# Patient Record
Sex: Male | Born: 1960 | Race: White | Hispanic: No | Marital: Married | Smoking: Former smoker
Health system: Southern US, Community
[De-identification: ages and names within clinical notes are randomized; demographics above are authoritative.]

## PROBLEM LIST (undated history)

## (undated) DIAGNOSIS — Z8719 Personal history of other diseases of the digestive system: Secondary | ICD-10-CM

## (undated) DIAGNOSIS — M5124 Other intervertebral disc displacement, thoracic region: Secondary | ICD-10-CM

## (undated) DIAGNOSIS — Z8711 Personal history of peptic ulcer disease: Secondary | ICD-10-CM

## (undated) DIAGNOSIS — I480 Paroxysmal atrial fibrillation: Secondary | ICD-10-CM

## (undated) HISTORY — PX: COLONOSCOPY: SHX174

## (undated) HISTORY — PX: OTHER SURGICAL HISTORY: SHX169

## (undated) HISTORY — PX: ESOPHAGOGASTRODUODENOSCOPY: SHX1529

## (undated) HISTORY — PX: FOOT SURGERY: SHX648

## (undated) HISTORY — DX: Paroxysmal atrial fibrillation: I48.0

---

## 2000-06-20 ENCOUNTER — Emergency Department (HOSPITAL_COMMUNITY): Admission: EM | Admit: 2000-06-20 | Discharge: 2000-06-20 | Payer: Self-pay | Admitting: *Deleted

## 2011-11-23 ENCOUNTER — Other Ambulatory Visit: Payer: Self-pay | Admitting: Gastroenterology

## 2011-11-23 DIAGNOSIS — R1013 Epigastric pain: Secondary | ICD-10-CM

## 2011-11-26 ENCOUNTER — Ambulatory Visit
Admission: RE | Admit: 2011-11-26 | Discharge: 2011-11-26 | Disposition: A | Discharge status: Home / Self Care | Payer: 59 | Source: Ambulatory Visit | Attending: Gastroenterology | Admitting: Gastroenterology

## 2011-11-26 DIAGNOSIS — R1013 Epigastric pain: Secondary | ICD-10-CM

## 2013-01-30 ENCOUNTER — Encounter (HOSPITAL_BASED_OUTPATIENT_CLINIC_OR_DEPARTMENT_OTHER): Payer: Self-pay | Admitting: Emergency Medicine

## 2013-01-30 ENCOUNTER — Emergency Department (HOSPITAL_BASED_OUTPATIENT_CLINIC_OR_DEPARTMENT_OTHER)
Admission: EM | Admit: 2013-01-30 | Discharge: 2013-01-31 | Disposition: A | Discharge status: Home / Self Care | Payer: 59 | Attending: Emergency Medicine | Admitting: Emergency Medicine

## 2013-01-30 ENCOUNTER — Emergency Department (HOSPITAL_BASED_OUTPATIENT_CLINIC_OR_DEPARTMENT_OTHER): Payer: 59

## 2013-01-30 DIAGNOSIS — Z8711 Personal history of peptic ulcer disease: Secondary | ICD-10-CM | POA: Insufficient documentation

## 2013-01-30 DIAGNOSIS — R350 Frequency of micturition: Secondary | ICD-10-CM | POA: Insufficient documentation

## 2013-01-30 DIAGNOSIS — K5732 Diverticulitis of large intestine without perforation or abscess without bleeding: Secondary | ICD-10-CM | POA: Insufficient documentation

## 2013-01-30 DIAGNOSIS — K5792 Diverticulitis of intestine, part unspecified, without perforation or abscess without bleeding: Secondary | ICD-10-CM

## 2013-01-30 HISTORY — DX: Personal history of other diseases of the digestive system: Z87.19

## 2013-01-30 HISTORY — DX: Personal history of peptic ulcer disease: Z87.11

## 2013-01-30 LAB — URINALYSIS, ROUTINE W REFLEX MICROSCOPIC
Glucose, UA: NEGATIVE mg/dL
Hgb urine dipstick: NEGATIVE
Ketones, ur: NEGATIVE mg/dL
Leukocytes, UA: NEGATIVE
Protein, ur: NEGATIVE mg/dL
Urobilinogen, UA: 0.2 mg/dL (ref 0.0–1.0)

## 2013-01-30 LAB — CBC WITH DIFFERENTIAL/PLATELET
Basophils Relative: 0 % (ref 0–1)
Eosinophils Absolute: 0.2 10*3/uL (ref 0.0–0.7)
Eosinophils Relative: 2 % (ref 0–5)
Lymphs Abs: 2.8 10*3/uL (ref 0.7–4.0)
MCH: 31.6 pg (ref 26.0–34.0)
MCHC: 35.5 g/dL (ref 30.0–36.0)
MCV: 89 fL (ref 78.0–100.0)
Platelets: 227 10*3/uL (ref 150–400)
RBC: 5.44 MIL/uL (ref 4.22–5.81)

## 2013-01-30 MED ORDER — SODIUM CHLORIDE 0.9 % IV BOLUS (SEPSIS)
1000.0000 mL | Freq: Once | INTRAVENOUS | Status: AC
  Administered 2013-01-31: 1000 mL via INTRAVENOUS

## 2013-01-30 MED ORDER — METRONIDAZOLE IN NACL 5-0.79 MG/ML-% IV SOLN
500.0000 mg | Freq: Once | INTRAVENOUS | Status: AC
  Administered 2013-01-31: 500 mg via INTRAVENOUS
  Filled 2013-01-30: qty 100

## 2013-01-30 MED ORDER — HYDROMORPHONE HCL PF 1 MG/ML IJ SOLN
1.0000 mg | Freq: Once | INTRAMUSCULAR | Status: AC
  Administered 2013-01-31: 1 mg via INTRAVENOUS
  Filled 2013-01-30: qty 1

## 2013-01-30 MED ORDER — ONDANSETRON HCL 4 MG/2ML IJ SOLN
4.0000 mg | Freq: Once | INTRAMUSCULAR | Status: AC
  Administered 2013-01-31: 4 mg via INTRAVENOUS
  Filled 2013-01-30: qty 2

## 2013-01-30 MED ORDER — CIPROFLOXACIN IN D5W 400 MG/200ML IV SOLN
400.0000 mg | Freq: Once | INTRAVENOUS | Status: AC
  Administered 2013-01-31: 400 mg via INTRAVENOUS
  Filled 2013-01-30: qty 200

## 2013-01-30 NOTE — ED Provider Notes (Signed)
CSN: [id number]     Arrival date & time 01/30/13  2250 History  This chart was scribed for Nicolas Quarry, MD by Nicolas Curtis, ED Scribe. This patient was seen in room Nicolas Curtis/Nicolas Curtis and the patient's care was started at 11:38 PM  Chief Complaint  Patient presents with  . Abdominal Pain   The history is provided by the patient. No language interpreter was used.   HPI Comments: Nicolas Curtis is a 52 y.o. male with a hx of ulcers who presents to the Emergency Department complaining of LLQ abd pain that began 5 days ago. Pt states his pain worsens when he is lying down  and moving for long periods of times. He states he pain has been constant and describes it as "stabbing pressure that does not radiate". Pt rates his current pain 5/10 currently. He states he has not had regular BM but states he has had frequency. Pt reports taking Malox with no relief. Pt states he has back complication (does not specify). Pt denies nausea, emesis, heamturia,and dysuria.Pt states he is currently taking Ameprazol 4x a day. Pt denies smoking and states he is an occasional drinker. His last drink was 24 hours ago.Pt deneis having any recent surgeries. Pt denies having colonoscopy done when he turned 52.  Pts PCP is Benedetto Goad.  Past Medical History  Diagnosis Date  . History of stomach ulcers    History reviewed. No pertinent past surgical history. No family history on file. History  Substance Use Topics  . Smoking status: Never Smoker   . Smokeless tobacco: Not on file  . Alcohol Use: Yes    Review of Systems  Constitutional: Negative for fever.  Gastrointestinal: Positive for abdominal pain. Negative for nausea, vomiting and diarrhea.  Genitourinary: Positive for frequency. Negative for urgency and hematuria.    Allergies  Codeine and Oxycodone  Home Medications   Current Outpatient Rx  Name  Route  Sig  Dispense  Refill  . DICYCLOMINE HCL PO   Oral   Take by mouth.         . MECLIZINE  HCL PO   Oral   Take by mouth.         . Omeprazole (PRILOSEC PO)   Oral   Take by mouth.          BP 175/93  Pulse 108  Temp(Src) 99.6 F (37.6 C) (Oral)  Resp 20  Ht 5\' 11"  (1.803 m)  Wt 229 lb (103.874 kg)  BMI 31.95 kg/m2  SpO2 98%  Physical Exam  Nursing note and vitals reviewed. Constitutional: He is oriented to person, place, and time. He appears well-developed and well-nourished.  HENT:  Head: Normocephalic and atraumatic.  Right Ear: External ear normal.  Left Ear: External ear normal.  Nose: Nose normal.  Mouth/Throat: Oropharynx is clear and moist.  Eyes: Conjunctivae and EOM are normal. Pupils are equal, round, and reactive to light.  Neck: Normal range of motion. Neck supple.  Cardiovascular: Normal rate, regular rhythm, normal heart sounds and intact distal pulses.   Pulmonary/Chest: Effort normal and breath sounds normal. No respiratory distress. He has no wheezes. He exhibits no tenderness.  Abdominal: Soft. Bowel sounds are normal. He exhibits no distension and no mass. There is no tenderness. There is no guarding.  Tenderness in LLQ.  Musculoskeletal: Normal range of motion.  Neurological: He is alert and oriented to person, place, and time. He has normal reflexes. He exhibits normal muscle tone. Coordination normal.  Skin:  Skin is warm and dry.  Psychiatric: He has a normal mood and affect. His behavior is normal. Judgment and thought content normal.   ED Course  Procedures DIAGNOSTIC STUDIES: Oxygen Saturation is 98% on RA, normal by my interpretation.    COORDINATION OF CARE: 11:43 PM-Discussed treatment plan which includes X-Julieanna Geraci, CBC, and UA. Pt agreed to plan.   Labs Review Labs Reviewed  CBC WITH DIFFERENTIAL - Abnormal; Notable for the following:    WBC 14.8 (*)    Hemoglobin 17.2 (*)    Neutro Abs 10.1 (*)    Monocytes Absolute 1.6 (*)    All other components within normal limits  COMPREHENSIVE METABOLIC PANEL - Abnormal; Notable  for the following:    Glucose, Bld 147 (*)    GFR calc non Af Amer 63 (*)    GFR calc Af Amer 73 (*)    All other components within normal limits  URINALYSIS, ROUTINE W REFLEX MICROSCOPIC - Abnormal; Notable for the following:    APPearance CLOUDY (*)    All other components within normal limits   Imaging Review Ct Abdomen Pelvis W Contrast  01/31/2013   CLINICAL DATA:  Left abdominal pain with nausea and vomiting.  EXAM: CT ABDOMEN AND PELVIS WITH CONTRAST  TECHNIQUE: Multidetector CT imaging of the abdomen and pelvis was performed using the standard protocol following bolus administration of intravenous contrast.  CONTRAST:  50mL OMNIPAQUE IOHEXOL 300 MG/ML SOLN, OMNIPAQUE IOHEXOL 300 MG/ML SOLN  COMPARISON:  Abdominal sonogram November 26, 2011  FINDINGS: Included view of the lung bases are clear. Visualized heart and pericardium are unremarkable.  The liver is diffusely hypodense most consistent with steatosis, and is otherwise unremarkable. The spleen, gallbladder, pancreas and adrenal glands are unremarkable.  Less than 5 mm segment of distal descending colonic inflammatory changes associated with diverticulum, trace free fluid within the left pericolic gutter, no drainable fluid collection/abscess. The stomach, small bowel are normal in course and caliber without inflammatory changes. Normal appendix. No intraperitoneal free air.  Kidneys are orthotopic, demonstrating symmetric enhancement without nephrolithiasis, hydronephrosis or renal masses. The unopacified ureters are normal in course and caliber. Delayed imaging through the kidneys demonstrates symmetric prompt excretion to the proximal urinary collecting system. Urinary bladder is partially distended and unremarkable.  Great vessels are normal in course and caliber with mild calcific atherosclerosis. No lymphadenopathy by CT size criteria ; subcentimeter porta hepatis lymph nodes may be reactive. Internal reproductive organs are  unremarkable. The soft tissues and included osseous structures are nonsuspicious. Mild degenerative disc disease L4-5 and L5-S1.  IMPRESSION: Short segment of distal descending colon acute diverticulitis without bowel perforation nor abscess.  Fatty liver.   Electronically Signed   By: Awilda Metro   On: 01/31/2013 01:47    EKG Interpretation   None       MDM  No diagnosis found. 52 year old male who presents today with multiple day history of left lower quadrant pain. He has not had nausea, vomiting, or diarrhea. He has been taking by mouth well. Exam here reveals a left lower quadrant tenderness with laboratory results revealing leukocytosis at 14,800. CT scan is consistent with diverticulitis. The patient is in generally good health, hemodynamically stable, able to take by mouth and has a primary care Dr. I discussed the patient's condition with the patient, his wife, and another family member. He has had some allergies in the past 2 oxycodone and codeine. He'll be given a prescription for hydrocodone, Cipro, and Flagyl. We have  discussed return precautions. We have discussed the need for followup with his primary care Dr. on Thursday. We have discussed that he will be out of work until Friday.  I personally performed the services described in this documentation, which was scribed in my presence. The recorded information has been reviewed and considered.   Nicolas Quarry, MD 01/31/13 [phone]

## 2013-01-30 NOTE — ED Notes (Signed)
C/o abd pain, fever since thanksgiving

## 2013-01-31 LAB — COMPREHENSIVE METABOLIC PANEL
ALT: 30 U/L (ref 0–53)
Albumin: 4.1 g/dL (ref 3.5–5.2)
BUN: 12 mg/dL (ref 6–23)
Calcium: 9.9 mg/dL (ref 8.4–10.5)
GFR calc Af Amer: 73 mL/min — ABNORMAL LOW (ref >90)
Glucose, Bld: 147 mg/dL — ABNORMAL HIGH (ref 70–99)
Sodium: 139 mEq/L (ref 135–145)
Total Protein: 7.6 g/dL (ref 6.0–8.3)

## 2013-01-31 MED ORDER — HYDROMORPHONE HCL PF 1 MG/ML IJ SOLN
1.0000 mg | Freq: Once | INTRAMUSCULAR | Status: AC
  Administered 2013-01-31: 1 mg via INTRAVENOUS
  Filled 2013-01-31: qty 1

## 2013-01-31 MED ORDER — ONDANSETRON 8 MG PO TBDP: 8.0000 mg | ORAL_TABLET | Freq: Three times a day (TID) | ORAL | Status: DC | PRN

## 2013-01-31 MED ORDER — METRONIDAZOLE 500 MG PO TABS: 500.0000 mg | ORAL_TABLET | Freq: Two times a day (BID) | ORAL | Status: DC

## 2013-01-31 MED ORDER — ONDANSETRON 8 MG PO TBDP
8.0000 mg | ORAL_TABLET | Freq: Once | ORAL | Status: AC
  Administered 2013-01-31: 8 mg via ORAL
  Filled 2013-01-31: qty 1

## 2013-01-31 MED ORDER — IOHEXOL 300 MG/ML  SOLN
50.0000 mL | Freq: Once | INTRAMUSCULAR | Status: AC | PRN
  Administered 2013-01-31: 50 mL via ORAL

## 2013-01-31 MED ORDER — HYDROCODONE-ACETAMINOPHEN 5-325 MG PO TABS: 2.0000 | ORAL_TABLET | ORAL | Status: DC | PRN

## 2013-01-31 MED ORDER — HYDROCODONE-ACETAMINOPHEN 5-325 MG PO TABS
1.0000 | ORAL_TABLET | Freq: Once | ORAL | Status: AC
  Administered 2013-01-31: 1 via ORAL
  Filled 2013-01-31: qty 1

## 2013-01-31 MED ORDER — IOHEXOL 300 MG/ML  SOLN
100.0000 mL | Freq: Once | INTRAMUSCULAR | Status: AC | PRN
  Administered 2013-01-31: 100 mL via INTRAVENOUS

## 2013-01-31 MED ORDER — CIPROFLOXACIN HCL 500 MG PO TABS: 500.0000 mg | ORAL_TABLET | Freq: Two times a day (BID) | ORAL | Status: DC

## 2013-01-31 NOTE — ED Notes (Signed)
MD at bedside discussing test results and plan of care. 

## 2013-01-31 NOTE — ED Notes (Signed)
Patient transported to CT 

## 2013-06-09 ENCOUNTER — Emergency Department (HOSPITAL_BASED_OUTPATIENT_CLINIC_OR_DEPARTMENT_OTHER)
Admission: EM | Admit: 2013-06-09 | Discharge: 2013-06-09 | Disposition: A | Discharge status: Home / Self Care | Payer: 59 | Attending: Emergency Medicine | Admitting: Emergency Medicine

## 2013-06-09 ENCOUNTER — Emergency Department (HOSPITAL_BASED_OUTPATIENT_CLINIC_OR_DEPARTMENT_OTHER): Payer: 59

## 2013-06-09 ENCOUNTER — Encounter (HOSPITAL_BASED_OUTPATIENT_CLINIC_OR_DEPARTMENT_OTHER): Payer: Self-pay | Admitting: Emergency Medicine

## 2013-06-09 DIAGNOSIS — Z8739 Personal history of other diseases of the musculoskeletal system and connective tissue: Secondary | ICD-10-CM | POA: Insufficient documentation

## 2013-06-09 DIAGNOSIS — R0789 Other chest pain: Secondary | ICD-10-CM

## 2013-06-09 DIAGNOSIS — R071 Chest pain on breathing: Secondary | ICD-10-CM | POA: Insufficient documentation

## 2013-06-09 DIAGNOSIS — Z87891 Personal history of nicotine dependence: Secondary | ICD-10-CM | POA: Insufficient documentation

## 2013-06-09 DIAGNOSIS — Z79899 Other long term (current) drug therapy: Secondary | ICD-10-CM | POA: Insufficient documentation

## 2013-06-09 DIAGNOSIS — Z8719 Personal history of other diseases of the digestive system: Secondary | ICD-10-CM | POA: Insufficient documentation

## 2013-06-09 HISTORY — DX: Other intervertebral disc displacement, thoracic region: M51.24

## 2013-06-09 NOTE — Discharge Instructions (Signed)

## 2013-06-09 NOTE — ED Provider Notes (Signed)
CSN: [id number]     Arrival date & time 06/09/13  [id number] History   First MD Initiated Contact with Patient 06/09/13 [id number]     Chief Complaint  Patient presents with  . Chest Pain     (Consider location/radiation/quality/duration/timing/severity/associated sxs/prior Treatment) Patient is a 53 y.o. male presenting with chest pain.  Chest Pain  Pt with no significant PMH reports several days of intermittent fleeting sharp pain in L lateral chest wall, radiating into his axilla. No associated chest pain, SOB or diaphoresis. No particular provoking or relieving factors.  Past Medical History  Diagnosis Date  . History of stomach ulcers   . Ruptured disc, thoracic    Past Surgical History  Procedure Laterality Date  . Gastric ulcer    . Scoliosis    . Colonoscopy    . Esophagogastroduodenoscopy     No family history on file. History  Substance Use Topics  . Smoking status: Former Games developermoker  . Smokeless tobacco: Not on file  . Alcohol Use: Yes     Comment: occasional    Review of Systems  Cardiovascular: Positive for chest pain.   All other systems reviewed and are negative except as noted in HPI.     Allergies  Codeine and Oxycodone  Home Medications   Current Outpatient Rx  Name  Route  Sig  Dispense  Refill  . ciprofloxacin (CIPRO) 500 MG tablet   Oral   Take 1 tablet (500 mg total) by mouth every 12 (twelve) hours.   10 tablet   0   . DICYCLOMINE HCL PO   Oral   Take by mouth.         Marland Kitchen. HYDROcodone-acetaminophen (NORCO/VICODIN) 5-325 MG per tablet   Oral   Take 2 tablets by mouth every 4 (four) hours as needed.   6 tablet   0   . MECLIZINE HCL PO   Oral   Take by mouth.         . metroNIDAZOLE (FLAGYL) 500 MG tablet   Oral   Take 1 tablet (500 mg total) by mouth 2 (two) times daily.   14 tablet   0   . Omeprazole (PRILOSEC PO)   Oral   Take by mouth.         . ondansetron (ZOFRAN ODT) 8 MG disintegrating tablet   Oral   Take 1 tablet (8 mg  total) by mouth every 8 (eight) hours as needed for nausea or vomiting.   20 tablet   0    BP 175/108  Pulse 73  Temp(Src) 98.1 F (36.7 C) (Oral)  Resp 16  Ht 5\' 11"  (1.803 m)  Wt 226 lb (102.513 kg)  BMI 31.53 kg/m2  SpO2 100% Physical Exam  Nursing note and vitals reviewed. Constitutional: He is oriented to person, place, and time. He appears well-developed and well-nourished.  HENT:  Head: Normocephalic and atraumatic.  Eyes: EOM are normal. Pupils are equal, round, and reactive to light.  Neck: Normal range of motion. Neck supple.  Cardiovascular: Normal rate, normal heart sounds and intact distal pulses.   Pulmonary/Chest: Effort normal and breath sounds normal. He has no wheezes. He has no rales. He exhibits tenderness (pain reproduces with chest wall palpation).  Abdominal: Bowel sounds are normal. He exhibits no distension. There is no tenderness.  Musculoskeletal: Normal range of motion. He exhibits no edema and no tenderness.  Neurological: He is alert and oriented to person, place, and time. He has normal strength. No cranial nerve  deficit or sensory deficit.  Skin: Skin is warm and dry. No rash noted.  Psychiatric: He has a normal mood and affect.    ED Course  Procedures (including critical care time) Labs Review Labs Reviewed - No data to display Imaging Review Dg Chest 2 View  06/09/2013   CLINICAL DATA:  Left chest and arm pain.  EXAM: CHEST  2 VIEW  COMPARISON:  None.  FINDINGS: The cardiomediastinal silhouette is within normal limits. The lungs are well inflated and clear. There is no evidence of pleural effusion or pneumothorax. No acute osseous abnormality is identified.  IMPRESSION: No active cardiopulmonary disease.   Electronically Signed   By: Sebastian Ache   On: 06/09/2013 08:59     EKG Interpretation None      MDM   Final diagnoses:  Chest wall pain    Normal exam, no concern for cardiac etiology. Most likely MSK. Normal CXR doubt mass, PTX,  PNA etc.     Charles B. Bernette Mayers, MD 06/09/13 0930

## 2013-06-09 NOTE — ED Notes (Signed)
Pt reports intermittent pain under left arm, radiating to left are occasionally x 1 week.  Pain is described as shooting pain unrelieved after taking Aleve.  Reports chest pain at rest 2 weeks ago but none since then.

## 2013-06-09 NOTE — ED Notes (Signed)
Pain isolated to left rib area and increases with palpation.

## 2018-08-22 ENCOUNTER — Ambulatory Visit: Payer: 59 | Admitting: Podiatry

## 2018-08-24 ENCOUNTER — Ambulatory Visit (INDEPENDENT_AMBULATORY_CARE_PROVIDER_SITE_OTHER): Payer: 59

## 2018-08-24 ENCOUNTER — Encounter: Payer: Self-pay | Admitting: Podiatry

## 2018-08-24 ENCOUNTER — Ambulatory Visit: Payer: 59 | Admitting: Podiatry

## 2018-08-24 ENCOUNTER — Other Ambulatory Visit: Payer: Self-pay

## 2018-08-24 VITALS — BP 132/85 | HR 63 | Temp 98.3°F

## 2018-08-24 DIAGNOSIS — M205X1 Other deformities of toe(s) (acquired), right foot: Secondary | ICD-10-CM

## 2018-08-24 NOTE — Patient Instructions (Signed)
Pre-Operative Instructions  Congratulations, you have decided to take an important step towards improving your quality of life.  You can be assured that the doctors and staff at [facility] will be with you every step of the way.  Here are some important things you should know:  1. Plan to be at the surgery center/hospital at least 1 (one) hour prior to your scheduled time, unless otherwise directed by the surgical center/hospital staff.  You must have a responsible adult accompany you, remain during the surgery and drive you home.  Make sure you have directions to the surgical center/hospital to ensure you arrive on time. 2. If you are having surgery at [facility] or [facility] hospitals, you will need a copy of your medical history and physical form from your family physician within one month prior to the date of surgery. We will give you a form for your primary physician to complete.  3. We make every effort to accommodate the date you request for surgery.  However, there are times where surgery dates or times have to be moved.  We will contact you as soon as possible if a change in schedule is required.   4. No aspirin/ibuprofen for one week before surgery.  If you are on aspirin, any non-steroidal anti-inflammatory medications (Mobic, Aleve, Ibuprofen) should not be taken seven (7) days prior to your surgery.  You make take Tylenol for pain prior to surgery.  5. Medications - If you are taking daily heart and blood pressure medications, seizure, reflux, allergy, asthma, anxiety, pain or diabetes medications, make sure you notify the surgery center/hospital before the day of surgery so they can tell you which medications you should take or avoid the day of surgery. 6. No food or drink after midnight the night before surgery unless directed otherwise by surgical center/hospital staff. 7. No alcoholic beverages 24-hours prior to surgery.  No smoking 24-hours prior or 24-hours after  surgery. 8. Wear loose pants or shorts. They should be loose enough to fit over bandages, boots, and casts. 9. Don't wear slip-on shoes. Sneakers are preferred. 10. Bring your boot with you to the surgery center/hospital.  Also bring crutches or a walker if your physician has prescribed it for you.  If you do not have this equipment, it will be provided for you after surgery. 11. If you have not been contacted by the surgery center/hospital by the day before your surgery, call to confirm the date and time of your surgery. 12. Leave-time from work may vary depending on the type of surgery you have.  Appropriate arrangements should be made prior to surgery with your employer. 13. Prescriptions will be provided immediately following surgery by your doctor.  Fill these as soon as possible after surgery and take the medication as directed. Pain medications will not be refilled on weekends and must be approved by the doctor. 14. Remove nail polish on the operative foot and avoid getting pedicures prior to surgery. 15. Wash the night before surgery.  The night before surgery wash the foot and leg well with water and the antibacterial soap provided. Be sure to pay special attention to beneath the toenails and in between the toes.  Wash for at least three (3) minutes. Rinse thoroughly with water and dry well with a towel.  Perform this wash unless told not to do so by your physician.  Enclosed: 1 Ice pack (please put in freezer the night before surgery)   1 Hibiclens skin cleaner     Pre-op instructions  If you have any questions regarding the instructions, please do not hesitate to call our office.  [place]: [address], [place], [place] [address] -- [phone]  [place]: [address] [place], [place] [address] -- [phone]  [place]: [address]  [place], [place] [address] -- [phone]  [place]: [address], [place], [place] [address] -- [phone]  Website:  [email or web address] 

## 2018-08-27 NOTE — Progress Notes (Signed)
   HPI: 58 year old male presenting today as a new patient, referred by Dr. Gershon Mussel, with a chief complaint of intermittent pain in the right great toe that began 2-3 months ago. He states he has received three injections for treatment but the pain is becoming more frequent. Trying to move the toe increases the pain. Patient is here for further evaluation and treatment.   Past Medical History:  Diagnosis Date  . History of stomach ulcers   . Ruptured disc, thoracic      Physical Exam: General: The patient is alert and oriented x3 in no acute distress.  Dermatology: Skin is warm, dry and supple bilateral lower extremities. Negative for open lesions or macerations.  Vascular: Palpable pedal pulses bilaterally. No edema or erythema noted. Capillary refill within normal limits.  Neurological: Epicritic and protective threshold grossly intact bilaterally.   Musculoskeletal Exam: Pain on palpation with limited range of motion noted to the first MPJ right foot.  Radiographic Exam: Degenerative changes noted with joint space narrowing first MPJ. There also appears to be extra-articular spurring noted about the joint.   Assessment: 1. Hallux limitus right    Plan of Care:  1. Patient evaluated. X-Rays reviewed.  2. Today we discussed the conservative versus surgical management of the presenting pathology. The patient opts for surgical management. All possible complications and details of the procedure were explained. All patient questions were answered. No guarantees were expressed or implied. 3. Authorization for surgery was initiated today. Surgery will consist of 1st MPJ arthroplasty with implant right.  4. CAM boot dispensed.  5. Return to clinic one week post op.      Edrick Kins, DPM [facility]  Dr. Edrick Kins, DPM    [address], [place] [address]                Office [phone]  Fax [phone]

## 2018-09-30 ENCOUNTER — Telehealth: Payer: Self-pay | Admitting: *Deleted

## 2018-09-30 NOTE — Telephone Encounter (Addendum)
"  I am calling about two things."  What's your questions?  "I was supposed to have received a call about an estimate for my surgery."  I'll get Jocelyn Lamer in our insurance department to calculate the estimate and give you a call with that information.  "Second thing, I would like to reschedule my surgery that's scheduled for August 27."  When would you like to reschedule it to?  "I'd like to do it in December.  I just found out that my job will not pay me for my time off.  So I need to work some overtime to accumulate some money for the time I will be out."  Dr. Amalia Hailey can do it on December 3.  "Okay put me down for then.  Hopefully, I'll have enough saved."  I'll get it rescheduled to 02/02/2019.  I rescheduled his surgery from 10/27/2018 to 02/02/2019 via the surgical center's online portal.  [facility] [phone])

## 2019-01-16 ENCOUNTER — Telehealth: Payer: Self-pay | Admitting: *Deleted

## 2019-01-16 NOTE — Telephone Encounter (Signed)
"  I'm scheduled to have an appointment on December 3.  I need to know a little more information.  What time will I need to be there?  I was told to contact [facility] for that information."  You are not having surgery here at the [facility].  Your surgery will be done at [facility].  You should have a brochure in the little bag that we gave you.  "I have it.  Will I need have to have a Covid test done before I go?"  You do not need to take a Covid test.  "Okay, that's all I need to know."

## 2019-01-23 ENCOUNTER — Telehealth: Payer: Self-pay | Admitting: *Deleted

## 2019-01-23 NOTE — Telephone Encounter (Signed)
DOS 02/02/2019 KELLER BUNION IMPLANT RIGHT FOOT - [id number]  [employer]: Eligibility Date - 04/02/2018 - 03/02/2019  Member's plan does not have an In-Network individual plan deductible.  Out-of-Pocket Maximum Per Calendar Year $472.24 of $5,000.00 Met Remaining: $4,527.76  Physician Fees for Surgical & Medical Services  Pending - Notification/Prior Authorization Number - [id number]

## 2019-01-25 NOTE — Telephone Encounter (Signed)
Overall Coverage Status   Covered/Approved    1-1 Code  Description  Coverage Status DECISION DATE    [facility]  Coverage determination is reflected for the facility admission and is not a guarantee of payment for ongoing services. Covered/Approved 01/25/2019  1 [id number] Hallux rigidus correction with cheilecto  Covered/Approved 01/25/2019   Authorization # is [id number]

## 2019-02-02 ENCOUNTER — Other Ambulatory Visit: Payer: Self-pay | Admitting: Podiatry

## 2019-02-02 DIAGNOSIS — M2021 Hallux rigidus, right foot: Secondary | ICD-10-CM | POA: Diagnosis not present

## 2019-02-02 MED ORDER — HYDROMORPHONE HCL 4 MG PO TABS: 4.0000 mg | ORAL_TABLET | ORAL | 0 refills | Status: DC | PRN

## 2019-02-02 NOTE — Progress Notes (Signed)
PRN postop 

## 2019-02-03 ENCOUNTER — Telehealth: Payer: Self-pay | Admitting: *Deleted

## 2019-02-03 NOTE — Telephone Encounter (Signed)
I spoke with pt's wife, Kennyth Lose and informed that the pt's numbing leg block could possibly last 48-72 hours, to check the temperature of the toes if warm that is good, press toes with a finger and if the color returns to normal quickly the circulation is good, and to begin the pain medication when pt begins to feel a tingling. I also informed pt of this information. Pt states his FMLA paperwork was given to the surgery center staff, who said they would give to Dr. Amalia Hailey and he would deliver to our office. I told pt is would send a message to Dr. Amalia Hailey and the [facility] Coordinator.

## 2019-02-03 NOTE — Telephone Encounter (Signed)
Pt's wife, Kennyth Lose states pt has surgery yesterday and still does not have feeling in the foot.

## 2019-02-08 ENCOUNTER — Ambulatory Visit (INDEPENDENT_AMBULATORY_CARE_PROVIDER_SITE_OTHER): Payer: 59

## 2019-02-08 ENCOUNTER — Ambulatory Visit (INDEPENDENT_AMBULATORY_CARE_PROVIDER_SITE_OTHER): Payer: 59 | Admitting: Podiatry

## 2019-02-08 ENCOUNTER — Other Ambulatory Visit: Payer: Self-pay

## 2019-02-08 ENCOUNTER — Encounter: Payer: Self-pay | Admitting: Podiatry

## 2019-02-08 DIAGNOSIS — Z9889 Other specified postprocedural states: Secondary | ICD-10-CM

## 2019-02-08 DIAGNOSIS — M205X1 Other deformities of toe(s) (acquired), right foot: Secondary | ICD-10-CM

## 2019-02-12 NOTE — Progress Notes (Signed)
   Subjective:  Patient presents today status post 1st MPJ implant right. DOS: 02/02/2019. He states he is doing well. He denies any significant pain or modifying factors. He has been using the CAM boot as directed. Patient is here for further evaluation and treatment.    Past Medical History:  Diagnosis Date  . History of stomach ulcers   . Ruptured disc, thoracic       Objective/Physical Exam Neurovascular status intact.  Skin incisions appear to be well coapted with sutures and staples intact. No sign of infectious process noted. No dehiscence. No active bleeding noted. Moderate edema noted to the surgical extremity.  Radiographic Exam:  Orthopedic hardware and osteotomies sites appear to be stable with routine healing.  Assessment: 1. s/p 1st MPJ implant right. DOS: 02/02/2019   Plan of Care:  1. Patient was evaluated. X-rays reviewed 2. Dressing changed.  3. Continue weightbearing in CAM boot.  4. Return to clinic in one week for suture removal.   Long haul truck driver.    Edrick Kins, DPM [facility]  Dr. Edrick Kins, [place] [address]                                        [place], [place] [address]                Office [phone]  Fax [phone]

## 2019-02-13 ENCOUNTER — Encounter: Payer: 59 | Admitting: Podiatry

## 2019-02-15 ENCOUNTER — Other Ambulatory Visit: Payer: Self-pay

## 2019-02-15 ENCOUNTER — Ambulatory Visit (INDEPENDENT_AMBULATORY_CARE_PROVIDER_SITE_OTHER): Payer: 59 | Admitting: Podiatry

## 2019-02-15 DIAGNOSIS — Z9889 Other specified postprocedural states: Secondary | ICD-10-CM | POA: Diagnosis not present

## 2019-02-15 DIAGNOSIS — M205X1 Other deformities of toe(s) (acquired), right foot: Secondary | ICD-10-CM

## 2019-02-20 NOTE — Progress Notes (Signed)
   Subjective:  Patient presents today status post 1st MPJ implant right. DOS: 02/02/2019. He states he is doing well but reports some soreness of the sub-first MPJ. He has been using the CAM boot as directed. There are no modifying factors noted. Patient is here for further evaluation and treatment.    Past Medical History:  Diagnosis Date  . History of stomach ulcers   . Ruptured disc, thoracic       Objective/Physical Exam Neurovascular status intact.  Skin incisions appear to be well coapted with sutures and staples intact. No sign of infectious process noted. No dehiscence. No active bleeding noted. Moderate edema noted to the surgical extremity.  Assessment: 1. s/p 1st MPJ implant right. DOS: 02/02/2019   Plan of Care:  1. Patient was evaluated.  2. Sutures removed.  3. Post op shoe dispensed. Discontinue using CAM boot.  4. Patient wants to return to work on 03/13/2019.  5. Return to clinic in 3 weeks to discuss return to work.   Long haul truck driver.    Edrick Kins, DPM [facility]  Dr. Edrick Kins, [place]                                        [place], [place] [address]                Office [phone]  Fax [phone]

## 2019-02-22 ENCOUNTER — Encounter: Payer: Self-pay | Admitting: Podiatry

## 2019-03-01 ENCOUNTER — Encounter: Payer: 59 | Admitting: Podiatry

## 2019-03-08 ENCOUNTER — Ambulatory Visit (INDEPENDENT_AMBULATORY_CARE_PROVIDER_SITE_OTHER): Payer: Self-pay | Admitting: Podiatry

## 2019-03-08 ENCOUNTER — Other Ambulatory Visit: Payer: Self-pay

## 2019-03-08 ENCOUNTER — Ambulatory Visit (INDEPENDENT_AMBULATORY_CARE_PROVIDER_SITE_OTHER): Payer: 59

## 2019-03-08 DIAGNOSIS — M205X1 Other deformities of toe(s) (acquired), right foot: Secondary | ICD-10-CM

## 2019-03-08 DIAGNOSIS — Z9889 Other specified postprocedural states: Secondary | ICD-10-CM

## 2019-03-08 MED ORDER — MELOXICAM 15 MG PO TABS: 15.0000 mg | ORAL_TABLET | Freq: Every day | ORAL | 1 refills | Status: DC

## 2019-03-10 ENCOUNTER — Encounter: Payer: Self-pay | Admitting: Podiatry

## 2019-03-12 NOTE — Progress Notes (Signed)
   Subjective:  Patient presents today status post 1st MPJ implant right. DOS: 02/02/2019. He states he is doing well but is still experiencing some burning and tingling to the medial aspect of the right great toe. He has been using the post op shoe and wearing good shoe gear as directed. Moving the toe increases the pain. Patient is here for further evaluation and treatment.    Past Medical History:  Diagnosis Date  . History of stomach ulcers   . Ruptured disc, thoracic       Objective/Physical Exam Neurovascular status intact.  Skin incisions appear to be well coapted. No sign of infectious process noted. No dehiscence. No active bleeding noted. Moderate edema noted to the surgical extremity. Pain with range of motion noted to the 1st MPJ.   Radiographic Exam:  Orthopedic hardware and osteotomies sites appear to be stable with routine healing.  Assessment: 1. s/p 1st MPJ implant right. DOS: 02/02/2019   Plan of Care:  1. Patient was evaluated. X-Rays reviewed.  2. Continue wearing good shoe gear.  3. Extension for return to work date provided.  4. Prescription for Meloxicam provided to patient. 5. Orders for physical therapy at [facility] physical therapy placed.  6. Return to clinic in 3 weeks.    Long haul truck driver.    Felecia Shelling, DPM [facility]  Dr. Felecia Shelling, DPM    [address]                                        [place], [place] [address]                Office [phone]  Fax [phone]

## 2019-03-29 ENCOUNTER — Ambulatory Visit (INDEPENDENT_AMBULATORY_CARE_PROVIDER_SITE_OTHER): Payer: 59 | Admitting: Podiatry

## 2019-03-29 ENCOUNTER — Other Ambulatory Visit: Payer: Self-pay

## 2019-03-29 DIAGNOSIS — Z9889 Other specified postprocedural states: Secondary | ICD-10-CM

## 2019-03-29 DIAGNOSIS — M205X1 Other deformities of toe(s) (acquired), right foot: Secondary | ICD-10-CM

## 2019-04-01 NOTE — Progress Notes (Signed)
   Subjective:  Patient presents today status post 1st MPJ implant right. DOS: 02/02/2019. He states he is doing well. He denies any pain or worsening factors. He reports some intermittent associated numbness. He has been doing physical therapy which helps alleviate his symptoms. Patient is here for further evaluation and treatment.   Past Medical History:  Diagnosis Date  . History of stomach ulcers   . Ruptured disc, thoracic       Objective/Physical Exam Neurovascular status intact.  Skin incisions appear to be well coapted. No sign of infectious process noted. No dehiscence. No active bleeding noted. Moderate edema noted to the surgical extremity.   Assessment: 1. s/p 1st MPJ implant right. DOS: 02/02/2019   Plan of Care:  1. Patient was evaluated.  2. May resume full activity with no restrictions.  3. Recommended good shoe gear.  4. Return to work on 03/31/2019.  5. Continue physical therapy as needed.  6. Return to clinic as needed.   Long haul truck driver.    Felecia Shelling, DPM [facility]  Dr. Felecia Shelling, DPM    [address]                                        [place], [place] [address]                Office [phone]  Fax [phone]

## 2019-04-28 ENCOUNTER — Other Ambulatory Visit: Payer: Self-pay | Admitting: Podiatry

## 2019-04-28 NOTE — Telephone Encounter (Signed)
Rx for Meloxicam 15mg sent to [facility] 

## 2021-03-02 ENCOUNTER — Emergency Department (HOSPITAL_COMMUNITY): Payer: 59

## 2021-03-02 ENCOUNTER — Inpatient Hospital Stay (HOSPITAL_COMMUNITY)
Admission: EM | Admit: 2021-03-02 | Discharge: 2021-03-05 | DRG: 699 | Disposition: A | Discharge status: Home / Self Care | Payer: 59 | Attending: Family Medicine | Admitting: Family Medicine

## 2021-03-02 ENCOUNTER — Encounter (HOSPITAL_COMMUNITY): Payer: Self-pay | Admitting: *Deleted

## 2021-03-02 ENCOUNTER — Observation Stay (HOSPITAL_COMMUNITY): Payer: 59

## 2021-03-02 ENCOUNTER — Other Ambulatory Visit: Payer: Self-pay

## 2021-03-02 DIAGNOSIS — I129 Hypertensive chronic kidney disease with stage 1 through stage 4 chronic kidney disease, or unspecified chronic kidney disease: Secondary | ICD-10-CM | POA: Diagnosis present

## 2021-03-02 DIAGNOSIS — D6852 Prothrombin gene mutation: Secondary | ICD-10-CM | POA: Diagnosis present

## 2021-03-02 DIAGNOSIS — Z87891 Personal history of nicotine dependence: Secondary | ICD-10-CM

## 2021-03-02 DIAGNOSIS — Z79899 Other long term (current) drug therapy: Secondary | ICD-10-CM

## 2021-03-02 DIAGNOSIS — I4891 Unspecified atrial fibrillation: Secondary | ICD-10-CM

## 2021-03-02 DIAGNOSIS — Z20822 Contact with and (suspected) exposure to covid-19: Secondary | ICD-10-CM | POA: Diagnosis present

## 2021-03-02 DIAGNOSIS — N280 Ischemia and infarction of kidney: Principal | ICD-10-CM | POA: Diagnosis present

## 2021-03-02 DIAGNOSIS — D6851 Activated protein C resistance: Secondary | ICD-10-CM | POA: Diagnosis present

## 2021-03-02 DIAGNOSIS — Z885 Allergy status to narcotic agent status: Secondary | ICD-10-CM

## 2021-03-02 DIAGNOSIS — R10A1 Flank pain, right side: Secondary | ICD-10-CM | POA: Diagnosis present

## 2021-03-02 DIAGNOSIS — I255 Ischemic cardiomyopathy: Secondary | ICD-10-CM | POA: Diagnosis present

## 2021-03-02 DIAGNOSIS — K760 Fatty (change of) liver, not elsewhere classified: Secondary | ICD-10-CM | POA: Diagnosis present

## 2021-03-02 DIAGNOSIS — R1110 Vomiting, unspecified: Secondary | ICD-10-CM

## 2021-03-02 DIAGNOSIS — R109 Unspecified abdominal pain: Secondary | ICD-10-CM | POA: Diagnosis present

## 2021-03-02 DIAGNOSIS — N182 Chronic kidney disease, stage 2 (mild): Secondary | ICD-10-CM

## 2021-03-02 DIAGNOSIS — K219 Gastro-esophageal reflux disease without esophagitis: Secondary | ICD-10-CM | POA: Diagnosis present

## 2021-03-02 DIAGNOSIS — Z8711 Personal history of peptic ulcer disease: Secondary | ICD-10-CM

## 2021-03-02 DIAGNOSIS — N289 Disorder of kidney and ureter, unspecified: Secondary | ICD-10-CM

## 2021-03-02 DIAGNOSIS — D735 Infarction of spleen: Secondary | ICD-10-CM | POA: Diagnosis present

## 2021-03-02 DIAGNOSIS — I700 Atherosclerosis of aorta: Secondary | ICD-10-CM | POA: Diagnosis present

## 2021-03-02 LAB — CBC WITH DIFFERENTIAL/PLATELET
Abs Immature Granulocytes: 0.02 10*3/uL (ref 0.00–0.07)
Basophils Absolute: 0 10*3/uL (ref 0.0–0.1)
Basophils Relative: 1 %
Eosinophils Absolute: 0.1 10*3/uL (ref 0.0–0.5)
Eosinophils Relative: 2 %
HCT: 48.8 % (ref 39.0–52.0)
Hemoglobin: 17.3 g/dL — ABNORMAL HIGH (ref 13.0–17.0)
Immature Granulocytes: 0 %
Lymphocytes Relative: 23 %
Lymphs Abs: 1.6 10*3/uL (ref 0.7–4.0)
MCH: 32.3 pg (ref 26.0–34.0)
MCHC: 35.5 g/dL (ref 30.0–36.0)
MCV: 91.2 fL (ref 80.0–100.0)
Monocytes Absolute: 0.7 10*3/uL (ref 0.1–1.0)
Monocytes Relative: 10 %
Neutro Abs: 4.4 10*3/uL (ref 1.7–7.7)
Neutrophils Relative %: 64 %
Platelets: 210 10*3/uL (ref 150–400)
RBC: 5.35 MIL/uL (ref 4.22–5.81)
RDW: 12.6 % (ref 11.5–15.5)
WBC: 6.9 10*3/uL (ref 4.0–10.5)
nRBC: 0 % (ref 0.0–0.2)

## 2021-03-02 LAB — URINALYSIS, ROUTINE W REFLEX MICROSCOPIC
Bilirubin Urine: NEGATIVE
Glucose, UA: NEGATIVE mg/dL
Hgb urine dipstick: NEGATIVE
Ketones, ur: NEGATIVE mg/dL
Leukocytes,Ua: NEGATIVE
Nitrite: NEGATIVE
Protein, ur: 30 mg/dL — AB
Specific Gravity, Urine: 1.006 (ref 1.005–1.030)
pH: 7 (ref 5.0–8.0)

## 2021-03-02 LAB — RESP PANEL BY RT-PCR (FLU A&B, COVID) ARPGX2
Influenza A by PCR: NEGATIVE
Influenza B by PCR: NEGATIVE
SARS Coronavirus 2 by RT PCR: NEGATIVE

## 2021-03-02 LAB — COMPREHENSIVE METABOLIC PANEL
ALT: 40 U/L (ref 0–44)
AST: 22 U/L (ref 15–41)
Albumin: 4.1 g/dL (ref 3.5–5.0)
Alkaline Phosphatase: 48 U/L (ref 38–126)
Anion gap: 8 (ref 5–15)
BUN: 14 mg/dL (ref 6–20)
CO2: 26 mmol/L (ref 22–32)
Calcium: 8.9 mg/dL (ref 8.9–10.3)
Chloride: 103 mmol/L (ref 98–111)
Creatinine, Ser: 1.29 mg/dL — ABNORMAL HIGH (ref 0.61–1.24)
GFR, Estimated: >60 mL/min (ref >60)
Glucose, Bld: 103 mg/dL — ABNORMAL HIGH (ref 70–99)
Potassium: 4.1 mmol/L (ref 3.5–5.1)
Sodium: 137 mmol/L (ref 135–145)
Total Bilirubin: 1 mg/dL (ref 0.3–1.2)
Total Protein: 6.8 g/dL (ref 6.5–8.1)

## 2021-03-02 MED ORDER — PANTOPRAZOLE SODIUM 40 MG PO TBEC
40.0000 mg | DELAYED_RELEASE_TABLET | Freq: Every day | ORAL | Status: DC
  Administered 2021-03-02 – 2021-03-05 (×4): 40 mg via ORAL
  Filled 2021-03-02 (×4): qty 1

## 2021-03-02 MED ORDER — IOHEXOL 350 MG/ML SOLN
100.0000 mL | Freq: Once | INTRAVENOUS | Status: AC | PRN
  Administered 2021-03-02: 80 mL via INTRAVENOUS

## 2021-03-02 MED ORDER — SODIUM CHLORIDE 0.45 % IV SOLN: INTRAVENOUS | Status: DC

## 2021-03-02 MED ORDER — KETOROLAC TROMETHAMINE 30 MG/ML IJ SOLN
30.0000 mg | Freq: Four times a day (QID) | INTRAMUSCULAR | Status: DC | PRN
  Administered 2021-03-02 – 2021-03-03 (×4): 30 mg via INTRAVENOUS
  Filled 2021-03-02 (×4): qty 1

## 2021-03-02 MED ORDER — ENOXAPARIN SODIUM 60 MG/0.6ML IJ SOSY
50.0000 mg | PREFILLED_SYRINGE | INTRAMUSCULAR | Status: DC
  Administered 2021-03-03: 50 mg via SUBCUTANEOUS
  Filled 2021-03-02: qty 0.6

## 2021-03-02 MED ORDER — IOHEXOL 350 MG/ML SOLN
80.0000 mL | Freq: Once | INTRAVENOUS | Status: AC | PRN
  Administered 2021-03-02: 80 mL via INTRAVENOUS

## 2021-03-02 MED ORDER — ZOLPIDEM TARTRATE 5 MG PO TABS: 5.0000 mg | ORAL_TABLET | Freq: Every evening | ORAL | Status: DC | PRN

## 2021-03-02 MED ORDER — IBUPROFEN 400 MG PO TABS: 400.0000 mg | ORAL_TABLET | Freq: Three times a day (TID) | ORAL | Status: DC | PRN

## 2021-03-02 NOTE — H&P (Signed)
History and Physical    Nicolas Curtis [id number] DOB: 02/06/1961 DOA: 03/02/2021  PCP: Sandi Mariscal, MD (Confirm with patient/family/NH records and if not entered, this has to be entered at [facility] point of entry) Patient coming from: home  I have personally briefly reviewed patient's old medical records in [place]  Chief Complaint: right flank/abdominal pain - worsening  HPI: Nicolas Curtis is a 61 y.o. male with medical history significant of h/o PUD, ruptered disc He reports the sudden on-set at 9:30 this AM of severe right flank/abdominal pain quickly followed by N/V. His pain was worse supine. He got no relief from 800 mg ibupfrofen. He presents to [facility] due to worsening right flank/abdominal pain. No prior h/o renal disease. NO recent injury, no cardiac history, no bleeding disorders.   ED Course: 97.8  139/87  HR 86  R 18. Lab reveals Cr 1.29  Hgb 17.3. U/A negative. CT abd/pelv - notable for wedge-shaped hypodensity interpolar region right kidney, gallstones w/o inflammation. TRH call to admit for further diagnostic workup for renal infarct.   Review of Systems: As per HPI otherwise 10 point review of systems negative.    Past Medical History:  Diagnosis Date   History of stomach ulcers    Ruptured disc, thoracic     Past Surgical History:  Procedure Laterality Date   COLONOSCOPY     ESOPHAGOGASTRODUODENOSCOPY     FOOT SURGERY Right    spacer placed between first and second digits   gastric ulcer     scoliosis      Soc Hx - married 28 years. [place], 3 grandchildren. 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   reports that he has quit smoking. He has never used smokeless tobacco. He reports current alcohol use. He reports that he does not use drugs.  Allergies  Allergen Reactions   Codeine Rash   Oxycodone Rash    No family history on file.   Prior to Admission medications   Medication Sig Start Date End Date Taking?  Authorizing Provider  ibuprofen (ADVIL) 800 MG tablet Take 800 mg by mouth every 8 (eight) hours as needed.   Yes [provider]  Omeprazole (PRILOSEC PO) Take by mouth.   Yes [provider]  ciprofloxacin (CIPRO) 500 MG tablet Take 1 tablet (500 mg total) by mouth every 12 (twelve) hours. Patient not taking: Reported on 03/02/2021 01/31/13   Pattricia Boss, MD  HYDROcodone-acetaminophen (NORCO/VICODIN) 5-325 MG per tablet Take 2 tablets by mouth every 4 (four) hours as needed. Patient not taking: Reported on 03/02/2021 01/31/13   Pattricia Boss, MD  HYDROmorphone (DILAUDID) 4 MG tablet Take 1 tablet (4 mg total) by mouth every 4 (four) hours as needed for severe pain. Patient not taking: Reported on 03/02/2021 02/02/19   Edrick Kins, DPM  meloxicam (MOBIC) 15 MG tablet TAKE 1 TABLET BY MOUTH EVERY DAY Patient not taking: Reported on 03/02/2021 04/28/19   Edrick Kins, DPM  metroNIDAZOLE (FLAGYL) 500 MG tablet Take 1 tablet (500 mg total) by mouth 2 (two) times daily. Patient not taking: Reported on 03/02/2021 01/31/13   Pattricia Boss, MD  ondansetron (ZOFRAN ODT) 8 MG disintegrating tablet Take 1 tablet (8 mg total) by mouth every 8 (eight) hours as needed for nausea or vomiting. Patient not taking: Reported on 03/02/2021 01/31/13   Pattricia Boss, MD    Physical Exam: Vitals:   03/02/21 1334 03/02/21 1400 03/02/21 1430 03/02/21 1600  BP: (!) 132/95 140/89 (!) 141/94 139/87  Pulse: 66 72 66 86  Resp: 16  16 18   Temp:      TempSrc:      SpO2: 99% 98% 97% 99%  Weight:      Height:         Vitals:   03/02/21 1334 03/02/21 1400 03/02/21 1430 03/02/21 1600  BP: (!) 132/95 140/89 (!) 141/94 139/87  Pulse: 66 72 66 86  Resp: 16  16 18   Temp:      TempSrc:      SpO2: 99% 98% 97% 99%  Weight:      Height:       General:  WNWD man in no distress Eyes: PERRL, lids and conjunctivae normal ENMT: Mucous membranes are moist. Posterior pharynx clear of any exudate or lesions.Normal  dentition.  Neck: normal, supple, no masses, no thyromegaly Respiratory: clear to auscultation bilaterally, no wheezing, no crackles. Normal respiratory effort. No accessory muscle use.  Cardiovascular: Regular rate and rhythm, no murmurs / rubs / gallops. No extremity edema. 2+ pedal pulses. No carotid bruits.  Abdomen: tender to deep palpation lower right quadrant and with pincher compression right flank-abdomen, no masses palpated. No hepatosplenomegaly. Bowel sounds positive.  Musculoskeletal: no clubbing / cyanosis. No joint deformity upper and lower extremities. Good ROM, no contractures. Normal muscle tone.  Skin: no rashes, lesions, ulcers. No induration Neurologic: CN 2-12 grossly intact. Sensation intact, DTR normal. Strength 5/5 in all 4.  Psychiatric: Normal judgment and insight. Alert and oriented x 3. Normal mood.     Labs on Admission: I have personally reviewed following labs and imaging studies  CBC: Recent Labs  Lab 03/02/21 1347  WBC 6.9  NEUTROABS 4.4  HGB 17.3*  HCT 48.8  MCV 91.2  PLT [id number]   Basic Metabolic Panel: Recent Labs  Lab 03/02/21 1347  NA 137  K 4.1  CL 103  CO2 26  GLUCOSE 103*  BUN 14  CREATININE 1.29*  CALCIUM 8.9   GFR: Estimated Creatinine Clearance: 77.7 mL/min (A) (by C-G formula based on SCr of 1.29 mg/dL (H)). Liver Function Tests: Recent Labs  Lab 03/02/21 1347  AST 22  ALT 40  ALKPHOS 48  BILITOT 1.0  PROT 6.8  ALBUMIN 4.1   No results for input(s): LIPASE, AMYLASE in the last 168 hours. No results for input(s): AMMONIA in the last 168 hours. Coagulation Profile: No results for input(s): INR, PROTIME in the last 168 hours. Cardiac Enzymes: No results for input(s): CKTOTAL, CKMB, CKMBINDEX, TROPONINI in the last 168 hours. BNP (last 3 results) No results for input(s): PROBNP in the last 8760 hours. HbA1C: No results for input(s): HGBA1C in the last 72 hours. CBG: No results for input(s): GLUCAP in the last 168  hours. Lipid Profile: No results for input(s): CHOL, HDL, LDLCALC, TRIG, CHOLHDL, LDLDIRECT in the last 72 hours. Thyroid Function Tests: No results for input(s): TSH, T4TOTAL, FREET4, T3FREE, THYROIDAB in the last 72 hours. Anemia Panel: No results for input(s): VITAMINB12, FOLATE, FERRITIN, TIBC, IRON, RETICCTPCT in the last 72 hours. Urine analysis:    Component Value Date/Time   COLORURINE YELLOW 03/02/2021 1300   APPEARANCEUR CLEAR 03/02/2021 1300   LABSPEC 1.006 03/02/2021 1300   PHURINE 7.0 03/02/2021 1300   GLUCOSEU NEGATIVE 03/02/2021 1300   HGBUR NEGATIVE 03/02/2021 1300   BILIRUBINUR NEGATIVE 03/02/2021 1300   KETONESUR NEGATIVE 03/02/2021 1300   PROTEINUR 30 (A) 03/02/2021 1300   UROBILINOGEN 0.2 01/30/2013 2329  NITRITE NEGATIVE 03/02/2021 1300   LEUKOCYTESUR NEGATIVE 03/02/2021 1300    Radiological Exams on Admission: CT ABDOMEN PELVIS W CONTRAST  Result Date: 03/02/2021 CLINICAL DATA:  Right lower quadrant abdominal pain. EXAM: CT ABDOMEN AND PELVIS WITH CONTRAST TECHNIQUE: Multidetector CT imaging of the abdomen and pelvis was performed using the standard protocol following bolus administration of intravenous contrast. CONTRAST:  55mL OMNIPAQUE IOHEXOL 350 MG/ML SOLN COMPARISON:  01/31/2013 FINDINGS: Lower chest: Lung bases are clear. Hepatobiliary: Calcified gallstones. Mild gallbladder distension without surrounding inflammatory changes. Decreased attenuation in the liver is suggestive for at least mild steatosis. No focal liver lesion. No biliary dilatation. Pancreas: Unremarkable. No pancreatic ductal dilatation or surrounding inflammatory changes. Spleen: Normal in size without focal abnormality. Adrenals/Urinary Tract: Normal adrenal glands. Left renal sinus cyst measuring up to 1.7 cm. Negative for left hydronephrosis. Focal low-density in the right kidney interpolar parenchyma. The low-density has a slightly wedge-shaped configuration and portions of this  low-density have well-defined borders suggesting a vascular distribution. Minimal right perinephric stranding. Normal appearance of the urinary bladder. No ureter dilatation. Stomach/Bowel: Colonic diverticula. No evidence for acute bowel inflammation. Normal appendix. Normal stomach. Vascular/Lymphatic: Mild atherosclerotic disease in the abdominal aorta and iliac arteries. Negative for an aortic aneurysm or dissection. Main visceral arteries are patent. Evidence for small bilateral accessory renal arteries. Mild fat stranding in the central abdominal mesentery appears similar to the exam from 2014. Slightly prominent lymph nodes in the central abdominal mesentery are also similar to prior examination. No significant lymph node enlargement in the abdomen or pelvis. Reproductive: Prostate is unremarkable. Other: Negative for ascites. Negative for free air. Small umbilical hernia containing fat. Musculoskeletal: No acute bone abnormality. IMPRESSION: 1. Focal low-density in the right kidney interpolar region. Differential diagnosis includes renal infarct versus focal pyelonephritis. No significant right perinephric stranding. Based on the morphology of the low-density area, favor a renal infarct. 2. Cholelithiasis without evidence for gallbladder inflammation. 3. Slightly decreased attenuation of the liver which could represent steatosis. 4.  Aortic Atherosclerosis (ICD10-I70.0). These results were called by telephone at the time of interpretation on 03/02/2021 at 3:21 pm to provider [facility] , who verbally acknowledged these results. Electronically Signed   By: Markus Daft M.D.   On: 03/02/2021 15:26    EKG: Independently reviewed. No EKG on chart  Assessment/Plan Active Problems:   Right flank pain   Renal insufficiency, mild  Renal infarct - patient with acute onset pain and hypodensity right kidney c/w renal infarct. No evidence of infection. 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 No h/o  arrythmia Plan Observation on telemetry - r/o PAF as source atheroemboli  CTA abdomen/pelvis to assess renal vasculature  Hypercoag labs: protein S,C, ATIII, Leiden's Factor V  ASA 325 mg daily  2. Renal insufficiency - no baseline lab available. BP at baseline. Question if due to #1 vs N/V Plan Hydrate with 1/2 NS at 62 cc/hr  Bmet in AM  DVT prophylaxis: lovenox  Code Status: frull code  Family Communication: wife present during interview and exam. Understands dx and tx plan  Disposition Plan: home 24-48 hrs  Consults called: none  Admission status: obs-tele    Adella Hare MD [facility] Pager [phone]  If 7PM-7AM, please contact night-coverage [email or web address] Password [facility]  03/02/2021, 4:44 PM

## 2021-03-02 NOTE — ED Triage Notes (Addendum)
Pt c/o sudden onset of right lower abdominal pain and vomiting that started 2 hours ago. Ibuprofen 800mg  last taken at 1030 this morning.

## 2021-03-02 NOTE — ED Provider Notes (Signed)
[facility] EMERGENCY DEPARTMENT Provider Note   CSN: [id number] Arrival date & time: 03/02/21  1201     History  Chief Complaint  Patient presents with   Abdominal Pain    Nicolas Curtis is a 61 y.o. male.  Pt complains of right sided abdominal pain that started today.  Pt reports decreased appetitie   The history is provided by the patient. No language interpreter was used.  Abdominal Pain Pain location:  Generalized Pain quality: aching   Pain radiates to:  Does not radiate Pain severity:  Moderate Onset quality:  Gradual Duration:  1 day Timing:  Constant Progression:  Worsening Chronicity:  New Relieved by:  Nothing Worsened by:  Nothing Ineffective treatments:  None tried Associated symptoms: anorexia   Associated symptoms: no fever   Risk factors: no alcohol abuse, has not had multiple surgeries and no NSAID use       Home Medications Prior to Admission medications   Medication Sig Start Date End Date Taking? Authorizing Provider  ibuprofen (ADVIL) 800 MG tablet Take 800 mg by mouth every 8 (eight) hours as needed.   Yes [provider]  Omeprazole (PRILOSEC PO) Take by mouth.   Yes [provider]  ciprofloxacin (CIPRO) 500 MG tablet Take 1 tablet (500 mg total) by mouth every 12 (twelve) hours. Patient not taking: Reported on 03/02/2021 01/31/13   Pattricia Boss, MD  HYDROcodone-acetaminophen (NORCO/VICODIN) 5-325 MG per tablet Take 2 tablets by mouth every 4 (four) hours as needed. Patient not taking: Reported on 03/02/2021 01/31/13   Pattricia Boss, MD  HYDROmorphone (DILAUDID) 4 MG tablet Take 1 tablet (4 mg total) by mouth every 4 (four) hours as needed for severe pain. Patient not taking: Reported on 03/02/2021 02/02/19   Edrick Kins, DPM  meloxicam (MOBIC) 15 MG tablet TAKE 1 TABLET BY MOUTH EVERY DAY Patient not taking: Reported on 03/02/2021 04/28/19   Edrick Kins, DPM  metroNIDAZOLE (FLAGYL) 500 MG tablet Take 1 tablet (500 mg total) by  mouth 2 (two) times daily. Patient not taking: Reported on 03/02/2021 01/31/13   Pattricia Boss, MD  ondansetron (ZOFRAN ODT) 8 MG disintegrating tablet Take 1 tablet (8 mg total) by mouth every 8 (eight) hours as needed for nausea or vomiting. Patient not taking: Reported on 03/02/2021 01/31/13   Pattricia Boss, MD      Allergies    Codeine and Oxycodone    Review of Systems   Review of Systems  Constitutional:  Negative for fever.  Gastrointestinal:  Positive for abdominal pain and anorexia.  All other systems reviewed and are negative.  Physical Exam Updated Vital Signs BP (!) 141/94    Pulse 66    Temp 97.8 F (36.6 C) (Oral)    Resp 16    Ht 5\' 11"  (1.803 m)    Wt 107 kg    SpO2 97%    BMI 32.92 kg/m  Physical Exam Vitals and nursing note reviewed.  Constitutional:      General: He is not in acute distress.    Appearance: He is well-developed.  HENT:     Head: Normocephalic and atraumatic.  Eyes:     Conjunctiva/sclera: Conjunctivae normal.  Cardiovascular:     Rate and Rhythm: Normal rate and regular rhythm.     Heart sounds: No murmur heard. Pulmonary:     Effort: Pulmonary effort is normal. No respiratory distress.     Breath sounds: Normal breath sounds.  Abdominal:     General:  Abdomen is flat. Bowel sounds are normal.     Palpations: Abdomen is soft.     Tenderness: There is abdominal tenderness in the right lower quadrant. There is right CVA tenderness.  Musculoskeletal:        General: No swelling.     Cervical back: Neck supple.  Skin:    General: Skin is warm and dry.     Capillary Refill: Capillary refill takes less than 2 seconds.  Neurological:     General: No focal deficit present.     Mental Status: He is alert.  Psychiatric:        Mood and Affect: Mood normal.    ED Results / Procedures / Treatments   Labs (all labs ordered are listed, but only abnormal results are displayed) Labs Reviewed  URINALYSIS, ROUTINE W REFLEX MICROSCOPIC - Abnormal;  Notable for the following components:      Result Value   Protein, ur 30 (*)    Bacteria, UA RARE (*)    All other components within normal limits  CBC WITH DIFFERENTIAL/PLATELET - Abnormal; Notable for the following components:   Hemoglobin 17.3 (*)    All other components within normal limits  COMPREHENSIVE METABOLIC PANEL - Abnormal; Notable for the following components:   Glucose, Bld 103 (*)    Creatinine, Ser 1.29 (*)    All other components within normal limits  RESP PANEL BY RT-PCR (FLU A&B, COVID) [id number]    EKG None  Radiology CT ABDOMEN PELVIS W CONTRAST  Result Date: 03/02/2021 CLINICAL DATA:  Right lower quadrant abdominal pain. EXAM: CT ABDOMEN AND PELVIS WITH CONTRAST TECHNIQUE: Multidetector CT imaging of the abdomen and pelvis was performed using the standard protocol following bolus administration of intravenous contrast. CONTRAST:  65mL OMNIPAQUE IOHEXOL 350 MG/ML SOLN COMPARISON:  01/31/2013 FINDINGS: Lower chest: Lung bases are clear. Hepatobiliary: Calcified gallstones. Mild gallbladder distension without surrounding inflammatory changes. Decreased attenuation in the liver is suggestive for at least mild steatosis. No focal liver lesion. No biliary dilatation. Pancreas: Unremarkable. No pancreatic ductal dilatation or surrounding inflammatory changes. Spleen: Normal in size without focal abnormality. Adrenals/Urinary Tract: Normal adrenal glands. Left renal sinus cyst measuring up to 1.7 cm. Negative for left hydronephrosis. Focal low-density in the right kidney interpolar parenchyma. The low-density has a slightly wedge-shaped configuration and portions of this low-density have well-defined borders suggesting a vascular distribution. Minimal right perinephric stranding. Normal appearance of the urinary bladder. No ureter dilatation. Stomach/Bowel: Colonic diverticula. No evidence for acute bowel inflammation. Normal appendix. Normal stomach. Vascular/Lymphatic: Mild  atherosclerotic disease in the abdominal aorta and iliac arteries. Negative for an aortic aneurysm or dissection. Main visceral arteries are patent. Evidence for small bilateral accessory renal arteries. Mild fat stranding in the central abdominal mesentery appears similar to the exam from 2014. Slightly prominent lymph nodes in the central abdominal mesentery are also similar to prior examination. No significant lymph node enlargement in the abdomen or pelvis. Reproductive: Prostate is unremarkable. Other: Negative for ascites. Negative for free air. Small umbilical hernia containing fat. Musculoskeletal: No acute bone abnormality. IMPRESSION: 1. Focal low-density in the right kidney interpolar region. Differential diagnosis includes renal infarct versus focal pyelonephritis. No significant right perinephric stranding. Based on the morphology of the low-density area, favor a renal infarct. 2. Cholelithiasis without evidence for gallbladder inflammation. 3. Slightly decreased attenuation of the liver which could represent steatosis. 4.  Aortic Atherosclerosis (ICD10-I70.0). These results were called by telephone at the time of interpretation on 03/02/2021 at 3:21  pm to provider [facility] , who verbally acknowledged these results. Electronically Signed   By: Markus Daft M.D.   On: 03/02/2021 15:26    Procedures Procedures    Medications Ordered in ED Medications  iohexol (OMNIPAQUE) 350 MG/ML injection 80 mL (80 mLs Intravenous Contrast Given 03/02/21 1453)    ED Course/ Medical Decision Making/ A&P                           Medical Decision Making  This patient presents to the ED for concern of Right lower abdominal pain this involves an extensive number of treatment options, and is a complaint that carries with it a high risk of complications and morbidity.  The differential diagnosis includes appendicitis, colitis, gastritis   Co morbidities that complicate the patient evaluation  No current  medical problems Additional history obtained:  Additional history obtained from wife External records from outside source obtained and reviewed including primary care visits reviewed,    Lab Tests:  I Ordered, and personally interpreted labs.  The pertinent results include:  CBC and cmet,  Pt has a normal wbc count.  Ua shows protein, Chemistry shows elevated creatine of 1.29   Imaging Studies ordered:  I ordered imaging studies including Ct abdomen I independently visualized and interpreted imaging which showed no appendicitis  right renal infarct I agree with the radiologist interpretation   Cardiac Monitoring:  The patient was maintained on a cardiac monitor.  I personally viewed and interpreted the cardiac monitored which showed an underlying rhythm of: normal sinus   Medicines ordered and prescription drug management:  I ordered medication including.  Pt declined pain or nausea medications Reevaluation of the patient after these medicines showed that the patient  I have reviewed the patients home medicines and have made adjustments as needed   Test Considered:  Lipase considered but no risk of pancreatitis    Critical Interventions:     Consultations Obtained:  I discussed resluts with Dr. Anselm Pancoast Radiologist I requested consultation with the Hospitalist  Dr. Linda Hedges ,  and discussed lab and imaging findings as well as pertinent plan - they recommend: admission    Problem List / ED Course:  Right Renal infarct   Reevaluation:  After the interventions noted above, I reevaluated the patient and found that they have :  Social Determinants of Health:     Dispostion:  After consideration of the diagnostic results and the patients response to treatment, I feel that the patent would benefit from admission        Final Clinical Impression(s) / ED Diagnoses Final diagnoses:  Renal infarct [facility])    Rx / DC Orders ED Discharge Orders     None          Sidney Ace 03/02/21 1606    Noemi Chapel, MD 03/06/21 [phone]

## 2021-03-03 ENCOUNTER — Observation Stay (HOSPITAL_COMMUNITY): Payer: 59

## 2021-03-03 ENCOUNTER — Other Ambulatory Visit (HOSPITAL_COMMUNITY): Payer: Self-pay | Admitting: *Deleted

## 2021-03-03 DIAGNOSIS — K760 Fatty (change of) liver, not elsewhere classified: Secondary | ICD-10-CM | POA: Diagnosis present

## 2021-03-03 DIAGNOSIS — I700 Atherosclerosis of aorta: Secondary | ICD-10-CM | POA: Diagnosis present

## 2021-03-03 DIAGNOSIS — N280 Ischemia and infarction of kidney: Secondary | ICD-10-CM | POA: Diagnosis present

## 2021-03-03 DIAGNOSIS — Z20822 Contact with and (suspected) exposure to covid-19: Secondary | ICD-10-CM | POA: Diagnosis present

## 2021-03-03 DIAGNOSIS — I129 Hypertensive chronic kidney disease with stage 1 through stage 4 chronic kidney disease, or unspecified chronic kidney disease: Secondary | ICD-10-CM | POA: Diagnosis present

## 2021-03-03 DIAGNOSIS — R1110 Vomiting, unspecified: Secondary | ICD-10-CM | POA: Diagnosis not present

## 2021-03-03 DIAGNOSIS — I340 Nonrheumatic mitral (valve) insufficiency: Secondary | ICD-10-CM | POA: Diagnosis not present

## 2021-03-03 DIAGNOSIS — I255 Ischemic cardiomyopathy: Secondary | ICD-10-CM | POA: Diagnosis present

## 2021-03-03 DIAGNOSIS — D735 Infarction of spleen: Secondary | ICD-10-CM | POA: Diagnosis present

## 2021-03-03 DIAGNOSIS — N182 Chronic kidney disease, stage 2 (mild): Secondary | ICD-10-CM

## 2021-03-03 DIAGNOSIS — Z8711 Personal history of peptic ulcer disease: Secondary | ICD-10-CM | POA: Diagnosis not present

## 2021-03-03 DIAGNOSIS — K219 Gastro-esophageal reflux disease without esophagitis: Secondary | ICD-10-CM | POA: Diagnosis present

## 2021-03-03 DIAGNOSIS — I361 Nonrheumatic tricuspid (valve) insufficiency: Secondary | ICD-10-CM

## 2021-03-03 DIAGNOSIS — I519 Heart disease, unspecified: Secondary | ICD-10-CM | POA: Diagnosis not present

## 2021-03-03 DIAGNOSIS — Z87891 Personal history of nicotine dependence: Secondary | ICD-10-CM | POA: Diagnosis not present

## 2021-03-03 DIAGNOSIS — Z79899 Other long term (current) drug therapy: Secondary | ICD-10-CM | POA: Diagnosis not present

## 2021-03-03 DIAGNOSIS — D6851 Activated protein C resistance: Secondary | ICD-10-CM | POA: Diagnosis present

## 2021-03-03 DIAGNOSIS — Z885 Allergy status to narcotic agent status: Secondary | ICD-10-CM | POA: Diagnosis not present

## 2021-03-03 DIAGNOSIS — D6852 Prothrombin gene mutation: Secondary | ICD-10-CM | POA: Diagnosis present

## 2021-03-03 DIAGNOSIS — I4891 Unspecified atrial fibrillation: Secondary | ICD-10-CM | POA: Diagnosis present

## 2021-03-03 LAB — ECHOCARDIOGRAM COMPLETE
AR max vel: 1.78 cm2
AV Area VTI: 1.78 cm2
AV Area mean vel: 1.83 cm2
AV Mean grad: 3.9 mmHg
AV Peak grad: 6.9 mmHg
Ao pk vel: 1.31 m/s
Area-P 1/2: 3.91 cm2
Height: 71 in
MV M vel: 5.32 m/s
MV Peak grad: 113.2 mmHg
S' Lateral: 3.9 cm
Weight: 3513.25 oz

## 2021-03-03 LAB — RAPID URINE DRUG SCREEN, HOSP PERFORMED
Amphetamines: NOT DETECTED
Barbiturates: NOT DETECTED
Benzodiazepines: NOT DETECTED
Cocaine: NOT DETECTED
Opiates: NOT DETECTED
Tetrahydrocannabinol: NOT DETECTED

## 2021-03-03 LAB — BASIC METABOLIC PANEL
Anion gap: 10 (ref 5–15)
BUN: 13 mg/dL (ref 6–20)
CO2: 26 mmol/L (ref 22–32)
Calcium: 8.6 mg/dL — ABNORMAL LOW (ref 8.9–10.3)
Chloride: 101 mmol/L (ref 98–111)
Creatinine, Ser: 1.44 mg/dL — ABNORMAL HIGH (ref 0.61–1.24)
GFR, Estimated: 56 mL/min — ABNORMAL LOW (ref >60)
Glucose, Bld: 105 mg/dL — ABNORMAL HIGH (ref 70–99)
Potassium: 4 mmol/L (ref 3.5–5.1)
Sodium: 137 mmol/L (ref 135–145)

## 2021-03-03 LAB — HIV ANTIBODY (ROUTINE TESTING W REFLEX): HIV Screen 4th Generation wRfx: NONREACTIVE

## 2021-03-03 LAB — PROCALCITONIN: Procalcitonin: <0.1 ng/mL

## 2021-03-03 LAB — LIPASE, BLOOD: Lipase: 48 U/L (ref 11–51)

## 2021-03-03 MED ORDER — PERFLUTREN LIPID MICROSPHERE
1.0000 mL | INTRAVENOUS | Status: AC | PRN
  Administered 2021-03-03: 3 mL via INTRAVENOUS
  Filled 2021-03-03: qty 10

## 2021-03-03 MED ORDER — ASPIRIN 325 MG PO TABS
325.0000 mg | ORAL_TABLET | Freq: Every day | ORAL | Status: DC
  Administered 2021-03-03: 325 mg via ORAL
  Filled 2021-03-03 (×2): qty 1

## 2021-03-03 MED ORDER — LACTATED RINGERS IV SOLN: INTRAVENOUS | Status: AC

## 2021-03-03 MED ORDER — METOPROLOL TARTRATE 5 MG/5ML IV SOLN
5.0000 mg | Freq: Four times a day (QID) | INTRAVENOUS | Status: DC
  Administered 2021-03-03 – 2021-03-04 (×3): 5 mg via INTRAVENOUS
  Filled 2021-03-03 (×3): qty 5

## 2021-03-03 NOTE — Progress Notes (Addendum)
Patients heart rate 128 then returning to 108 then heart rate comes back up to the 120s. Patient reports no SOB, chest pain or heart racing. Tele monitor shows patient in A-fib with PVCs verified by tele. Vital signs: T-98.2, P-123, R-20, BP-149/103, O2-98% at Room air. Patient stated that he stopped taking BP meds a year ago but checks BP at home. MD Tat made aware. New orders given. EKG obtained per verbal order from MD Tat and Metoprolol IV given.

## 2021-03-03 NOTE — Progress Notes (Signed)
°   03/03/21 1830  Assess: MEWS Score  Temp 98.2 F (36.8 C)  BP (!) 149/103  ECG Heart Rate (!) 123  Resp 20  SpO2 98 %  O2 Device Room Air  Assess: MEWS Score  MEWS Temp 0  MEWS Systolic 0  MEWS Pulse 2  MEWS RR 0  MEWS LOC 0  MEWS Score 2  MEWS Score Color Yellow  Assess: if the MEWS score is Yellow or Red  Were vital signs taken at a resting state? Yes  Focused Assessment Change from prior assessment (see assessment flowsheet)  Early Detection of Sepsis Score *See Row Information* Low  MEWS guidelines implemented *See Row Information* Yes  Treat  Pain Scale 0-10  Pain Score 3  Pain Type Acute pain  Pain Location Abdomen  Pain Intervention(s) Medication (See eMAR)  Take Vital Signs  Increase Vital Sign Frequency  Yellow: Q 2hr X 2 then Q 4hr X 2, if remains yellow, continue Q 4hrs  Escalate  MEWS: Escalate Yellow: discuss with charge nurse/RN and consider discussing with provider and RRT  Notify: Charge Nurse/RN  Name of Charge Nurse/RN Notified Irving Burton RN  Date Charge Nurse/RN Notified 03/03/21  Time Charge Nurse/RN Notified 1840  Notify: Provider  Provider Name/Title Catarina Hartshorn MD  Date Provider Notified 03/03/21  Time Provider Notified 1840  Notification Type Page (Secure chat)  Notification Reason Change in status [facility] MEWS)  Provider response See new orders

## 2021-03-03 NOTE — TOC Progression Note (Signed)
Transition of Care [facility]) - Progression Note    Patient Details  Name: Keanan Melander MRN: [id number] Date of Birth: October 20, 1960  Transition of Care [facility]) CM/SW Contact  Karn Cassis, [place] Phone Number: 03/03/2021, 10:12 AM  Clinical Narrative:   Transition of Care [facility]) Screening Note   Patient Details  Name: Anthem Frazer Date of Birth: 1960-04-18   Transition of Care [facility]) CM/SW Contact:    Karn Cassis, LCSW Phone Number: 03/03/2021, 10:12 AM   Transition of Care Department [facility]) has reviewed patient and no TOC needs have been identified at this time. We will continue to monitor patient advancement through interdisciplinary progression rounds. If new patient transition needs arise, please place a TOC consult.            Expected Discharge Plan and Services                                                 Social Determinants of Health (SDOH) Interventions    Readmission Risk Interventions No flowsheet data found.

## 2021-03-03 NOTE — Progress Notes (Signed)
*  PRELIMINARY RESULTS* Echocardiogram 2D Echocardiogram has been performed with Definity.  Stacey Drain 03/03/2021, 9:16 AM

## 2021-03-03 NOTE — Progress Notes (Addendum)
Patients was given metoprolol IV, highest patients Heart rate this writer has seen now is 120. MD Tat aware. Reported to on-coming nurse.

## 2021-03-03 NOTE — Progress Notes (Signed)
PROGRESS NOTE  Nicolas Curtis [id number] DOB: Apr 10, 1960 DOA: 03/02/2021 PCP: Sandi Mariscal, MD  Brief History:  61 year old male with a history of hypertension not on medication and peptic ulcer disease presenting with acute onset of right-sided abdominal pain, primarily right lower quadrant, that began in the early morning 03/02/2021 while the patient was having sexual intercourse.  The patient described the pain as severe and sharp.  He went to lay down after which he began having numerous episodes of nausea and vomiting without blood.  He states that he went to take some ibuprofen and lay down, but later had more emesis.  Prior to this episode, the patient had been in his usual state of health without any complaints.  Patient denies fevers, chills, headache, chest pain, dyspnea, nausea, vomiting, diarrhea, abdominal pain, dysuria, hematuria, hematochezia, and melena. The patient is a former smoker quitting 17 years ago.  Used to drink daily, but quit drinking 2 weeks ago.  He is a Administrator by trade.  He denies any illicit drugs. CT abdomen and pelvis showed mild gallbladder distention without inflammation.  There is hepatic steatosis.  There is a focal low-density in the right renal parenchyma with a wedge-shaped configuration concerning for vascular phenomena.  CTA abdomen showed mild narrowing of the celiac artery without significant hemodynamic stenosis.  There is no other acute vascular findings.  Patient was admitted for further work-up and treatment of his renal infarct.  Assessment/Plan: Abdominal pain/right renal infarct -Echocardiogram -anti phospholipid antibody -Prothrombin gene mutation -Factor V Leiden -Urine drug screen -Blood cultures x2 sets -SPEP -Judicious opioids  Intractable nausea and vomiting -Seems to be improving -Continue IV fluids -Check lipase -Gradually advance diet -Continue PPI  Essential hypertension -Patient not currently on any  medications -Monitor clinically  GERD -Continue Protonix  CKD stage II -Baseline creatinine 1.2-1.4       Family Communication: no  Family at bedside  Consultants:  none  Code Status:  FULL   DVT Prophylaxis:  [place] Lovenox   Procedures: As Listed in Progress Note Above  Antibiotics: None       Subjective: Patient states that his abdominal pain is 50% better.  He denies any fevers, chills, chest pain, shortness breath, cough, hemoptysis, nausea vomiting or direct abdominal pain.  Objective: Vitals:   03/02/21 2003 03/02/21 2004 03/03/21 0004 03/03/21 0416  BP:  (!) 154/97 127/81 129/77  Pulse: 80 71 67 66  Resp:  18 18 18   Temp:  98.9 F (37.2 C) 98.4 F (36.9 C) 98 F (36.7 C)  TempSrc:  Oral Oral Oral  SpO2: 100% 100% 97% 95%  Weight:    99.6 kg  Height:    5\' 11"  (1.803 m)    Intake/Output Summary (Last 24 hours) at 03/03/2021 0736 Last data filed at 03/03/2021 0400 Gross per 24 hour  Intake 825.67 ml  Output --  Net 825.67 ml   Weight change:  Exam:  General:  Pt is alert, follows commands appropriately, not in acute distress HEENT: No icterus, No thrush, No neck mass, [place]/AT Cardiovascular: RRR, S1/S2, no rubs, no gallops Respiratory: CTA bilaterally, no wheezing, no crackles, no rhonchi Abdomen: Soft/+BS, non tender, non distended, no guarding Extremities: No edema, No lymphangitis, No petechiae, No rashes, no synovitis   Data Reviewed: I have personally reviewed following labs and imaging studies Basic Metabolic Panel: Recent Labs  Lab 03/02/21 1347 03/03/21 0422  NA 137 137  K 4.1 4.0  CL 103 101  CO2 26 26  GLUCOSE 103* 105*  BUN 14 13  CREATININE 1.29* 1.44*  CALCIUM 8.9 8.6*   Liver Function Tests: Recent Labs  Lab 03/02/21 1347  AST 22  ALT 40  ALKPHOS 48  BILITOT 1.0  PROT 6.8  ALBUMIN 4.1   No results for input(s): LIPASE, AMYLASE in the last 168 hours. No results for input(s): AMMONIA in the last 168  hours. Coagulation Profile: No results for input(s): INR, PROTIME in the last 168 hours. CBC: Recent Labs  Lab 03/02/21 1347  WBC 6.9  NEUTROABS 4.4  HGB 17.3*  HCT 48.8  MCV 91.2  PLT 210   Cardiac Enzymes: No results for input(s): CKTOTAL, CKMB, CKMBINDEX, TROPONINI in the last 168 hours. BNP: Invalid input(s): POCBNP CBG: No results for input(s): GLUCAP in the last 168 hours. HbA1C: No results for input(s): HGBA1C in the last 72 hours. Urine analysis:    Component Value Date/Time   COLORURINE YELLOW 03/02/2021 1300   APPEARANCEUR CLEAR 03/02/2021 1300   LABSPEC 1.006 03/02/2021 1300   PHURINE 7.0 03/02/2021 1300   GLUCOSEU NEGATIVE 03/02/2021 1300   HGBUR NEGATIVE 03/02/2021 1300   BILIRUBINUR NEGATIVE 03/02/2021 1300   KETONESUR NEGATIVE 03/02/2021 1300   PROTEINUR 30 (A) 03/02/2021 1300   UROBILINOGEN 0.2 01/30/2013 2329   NITRITE NEGATIVE 03/02/2021 1300   LEUKOCYTESUR NEGATIVE 03/02/2021 1300   Sepsis Labs: @LABRCNTIP (procalcitonin:4,lacticidven:4) ) Recent Results (from the past 240 hour(s))  Resp Panel by RT-PCR (Flu A&B, Covid) Nasopharyngeal Swab     Status: None   Collection Time: 03/02/21  3:36 PM   Specimen: Nasopharyngeal Swab; Nasopharyngeal(NP) swabs in vial transport medium  Result Value Ref Range Status   SARS Coronavirus 2 by RT PCR NEGATIVE NEGATIVE Final    Comment: (NOTE) SARS-CoV-2 target nucleic acids are NOT DETECTED.  The SARS-CoV-2 RNA is generally detectable in upper respiratory specimens during the acute phase of infection. The lowest concentration of SARS-CoV-2 viral copies this assay can detect is 138 copies/mL. A negative result does not preclude SARS-Cov-2 infection and should not be used as the sole basis for treatment or other patient management decisions. A negative result may occur with  improper specimen collection/handling, submission of specimen other than nasopharyngeal swab, presence of viral mutation(s) within  the areas targeted by this assay, and inadequate number of viral copies(<138 copies/mL). A negative result must be combined with clinical observations, patient history, and epidemiological information. The expected result is Negative.  Fact Sheet for Patients:  EntrepreneurPulse.com.au  Fact Sheet for Healthcare Providers:  IncredibleEmployment.be  This test is no t yet approved or cleared by the [place] FDA and  has been authorized for detection and/or diagnosis of SARS-CoV-2 by FDA under an Emergency Use Authorization (EUA). This EUA will remain  in effect (meaning this test can be used) for the duration of the COVID-19 declaration under Section 564(b)(1) of the Act, 21 U.S.C.section 360bbb-3(b)(1), unless the authorization is terminated  or revoked sooner.       Influenza A by PCR NEGATIVE NEGATIVE Final   Influenza B by PCR NEGATIVE NEGATIVE Final    Comment: (NOTE) The Xpert Xpress SARS-CoV-2/FLU/RSV plus assay is intended as an aid in the diagnosis of influenza from Nasopharyngeal swab specimens and should not be used as a sole basis for treatment. Nasal washings and aspirates are unacceptable for Xpert Xpress SARS-CoV-2/FLU/RSV testing.  Fact Sheet for Patients: EntrepreneurPulse.com.au  Fact Sheet for Healthcare Providers: IncredibleEmployment.be  This test is not yet  approved or cleared by the [place] and has been authorized for detection and/or diagnosis of SARS-CoV-2 by FDA under an Emergency Use Authorization (EUA). This EUA will remain in effect (meaning this test can be used) for the duration of the COVID-19 declaration under Section 564(b)(1) of the Act, 21 U.S.C. section 360bbb-3(b)(1), unless the authorization is terminated or revoked.  Performed at [facility], [address], [place], [place] [address]      Scheduled Meds:  aspirin  325 mg Oral Daily   enoxaparin  (LOVENOX) injection  50 mg Subcutaneous Q24H   pantoprazole  40 mg Oral Daily   Continuous Infusions:  Procedures/Studies: CT ANGIO ABDOMEN W &/OR WO CONTRAST  Result Date: 03/02/2021 CLINICAL DATA:  Renal ischemia or infarction.  Abnormal CT scan. EXAM: CT ANGIOGRAPHY ABDOMEN TECHNIQUE: Multidetector CT imaging of the abdomen was performed using the standard protocol during bolus administration of intravenous contrast. Multiplanar reconstructed images and MIPs were obtained and reviewed to evaluate the vascular anatomy. CONTRAST:  22mL OMNIPAQUE IOHEXOL 350 MG/ML SOLN COMPARISON:  CT of the abdomen and pelvis 03/02/21 at 2:55. FINDINGS: VASCULAR Aorta: Atherosclerotic irregularities are present without aneurysm or stenosis. Celiac: Mild narrowing present at the origin without significant stenosis relative to the more distal vessel. Branch vessels are within normal limits. SMA: Patent without evidence of aneurysm, dissection, vasculitis or significant stenosis. Renals: Both renal arteries are patent without evidence of aneurysm, dissection, vasculitis, fibromuscular dysplasia or significant stenosis. IMA: Patent without evidence of aneurysm, dissection, vasculitis or significant stenosis. Inflow: Patent without evidence of aneurysm, dissection, vasculitis or significant stenosis. Veins: No obvious venous abnormality within the limitations of this arterial phase study. Review of the MIP images confirms the above findings. NON-VASCULAR Lower chest: The lung bases are clear.  Heart size is normal. Hepatobiliary: Calcified gallstones again noted without inflammatory change. Diffuse fatty infiltration of the liver noted. No focal hepatic lesions present. Pancreas: Unremarkable. No pancreatic ductal dilatation or surrounding inflammatory changes. Spleen: Normal in size without focal abnormality. Adrenals/Urinary Tract: The area of hypoattenuation in the posterior right kidney is unchanged. No other mass lesion or  stone is present. No obstruction present. Central sinus cyst again noted on the left. Ureters are within normal limits. Stomach/Bowel: Colonic diverticula are present. Visualized bowel is otherwise within normal limits. Lymphatic: No significant adenopathy is present. Other: No abdominal wall hernia or abnormality. No abdominopelvic ascites. Musculoskeletal: No acute or significant osseous findings. IMPRESSION: 1. 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 2. Atherosclerotic changes of the abdominal aorta without aneurysm or stenosis. Aortic Atherosclerosis (ICD10-I70.0). 3. Stable appearance of low-density in the posterior right kidney. This likely represents a focal infarct as previously described. 4. Hepatic steatosis. 5. Cholelithiasis without cholecystitis. 6. Colonic diverticulosis without diverticulitis. Electronically Signed   By: San Morelle M.D.   On: 03/02/2021 18:19   CT ABDOMEN PELVIS W CONTRAST  Result Date: 03/02/2021 CLINICAL DATA:  Right lower quadrant abdominal pain. EXAM: CT ABDOMEN AND PELVIS WITH CONTRAST TECHNIQUE: Multidetector CT imaging of the abdomen and pelvis was performed using the standard protocol following bolus administration of intravenous contrast. CONTRAST:  36mL OMNIPAQUE IOHEXOL 350 MG/ML SOLN COMPARISON:  01/31/2013 FINDINGS: Lower chest: Lung bases are clear. Hepatobiliary: Calcified gallstones. Mild gallbladder distension without surrounding inflammatory changes. Decreased attenuation in the liver is suggestive for at least mild steatosis. No focal liver lesion. No biliary dilatation. Pancreas: Unremarkable. No pancreatic ductal dilatation or surrounding  inflammatory changes. Spleen: Normal in size without focal abnormality. Adrenals/Urinary Tract: Normal adrenal glands. Left renal sinus cyst measuring up to 1.7 cm. Negative for left hydronephrosis. Focal low-density in the right kidney  interpolar parenchyma. The low-density has a slightly wedge-shaped configuration and portions of this low-density have well-defined borders suggesting a vascular distribution. Minimal right perinephric stranding. Normal appearance of the urinary bladder. No ureter dilatation. Stomach/Bowel: Colonic diverticula. No evidence for acute bowel inflammation. Normal appendix. Normal stomach. Vascular/Lymphatic: Mild atherosclerotic disease in the abdominal aorta and iliac arteries. Negative for an aortic aneurysm or dissection. Main visceral arteries are patent. Evidence for small bilateral accessory renal arteries. Mild fat stranding in the central abdominal mesentery appears similar to the exam from 2014. Slightly prominent lymph nodes in the central abdominal mesentery are also similar to prior examination. No significant lymph node enlargement in the abdomen or pelvis. Reproductive: Prostate is unremarkable. Other: Negative for ascites. Negative for free air. Small umbilical hernia containing fat. Musculoskeletal: No acute bone abnormality. IMPRESSION: 1. Focal low-density in the right kidney interpolar region. Differential diagnosis includes renal infarct versus focal pyelonephritis. No significant right perinephric stranding. Based on the morphology of the low-density area, favor a renal infarct. 2. Cholelithiasis without evidence for gallbladder inflammation. 3. Slightly decreased attenuation of the liver which could represent steatosis. 4.  Aortic Atherosclerosis (ICD10-I70.0). These results were called by telephone at the time of interpretation on 03/02/2021 at 3:21 pm to provider [facility] , who verbally acknowledged these results. Electronically Signed   By: Markus Daft M.D.   On: 03/02/2021 15:26    Orson Eva, DO  [facility]  If 7PM-7AM, please contact night-coverage [email or web address] Password [email or web address] 03/03/2021, 7:36 AM   LOS: 0 days

## 2021-03-04 DIAGNOSIS — I700 Atherosclerosis of aorta: Secondary | ICD-10-CM

## 2021-03-04 DIAGNOSIS — I4891 Unspecified atrial fibrillation: Secondary | ICD-10-CM

## 2021-03-04 DIAGNOSIS — I519 Heart disease, unspecified: Secondary | ICD-10-CM

## 2021-03-04 LAB — COMPREHENSIVE METABOLIC PANEL
ALT: 32 U/L (ref 0–44)
AST: 17 U/L (ref 15–41)
Albumin: 4 g/dL (ref 3.5–5.0)
Alkaline Phosphatase: 50 U/L (ref 38–126)
Anion gap: 10 (ref 5–15)
BUN: 15 mg/dL (ref 6–20)
CO2: 23 mmol/L (ref 22–32)
Calcium: 8.9 mg/dL (ref 8.9–10.3)
Chloride: 103 mmol/L (ref 98–111)
Creatinine, Ser: 1.24 mg/dL (ref 0.61–1.24)
GFR, Estimated: >60 mL/min (ref >60)
Glucose, Bld: 102 mg/dL — ABNORMAL HIGH (ref 70–99)
Potassium: 4.1 mmol/L (ref 3.5–5.1)
Sodium: 136 mmol/L (ref 135–145)
Total Bilirubin: 1.2 mg/dL (ref 0.3–1.2)
Total Protein: 7.1 g/dL (ref 6.5–8.1)

## 2021-03-04 LAB — PROTEIN S ACTIVITY: Protein S Activity: 102 % (ref 63–140)

## 2021-03-04 LAB — PROTEIN S, TOTAL: Protein S Ag, Total: 99 % (ref 60–150)

## 2021-03-04 LAB — CBC
HCT: 52.6 % — ABNORMAL HIGH (ref 39.0–52.0)
Hemoglobin: 17.8 g/dL — ABNORMAL HIGH (ref 13.0–17.0)
MCH: 30.7 pg (ref 26.0–34.0)
MCHC: 33.8 g/dL (ref 30.0–36.0)
MCV: 90.8 fL (ref 80.0–100.0)
Platelets: 225 10*3/uL (ref 150–400)
RBC: 5.79 MIL/uL (ref 4.22–5.81)
RDW: 12.5 % (ref 11.5–15.5)
WBC: 12.8 10*3/uL — ABNORMAL HIGH (ref 4.0–10.5)
nRBC: 0 % (ref 0.0–0.2)

## 2021-03-04 LAB — MAGNESIUM: Magnesium: 2.1 mg/dL (ref 1.7–2.4)

## 2021-03-04 LAB — TSH: TSH: 3.193 u[IU]/mL (ref 0.350–4.500)

## 2021-03-04 LAB — PROTEIN C ACTIVITY: Protein C Activity: 116 % (ref 73–180)

## 2021-03-04 MED ORDER — POLYETHYLENE GLYCOL 3350 17 G PO PACK
17.0000 g | PACK | Freq: Every day | ORAL | Status: DC
  Administered 2021-03-04: 17 g via ORAL
  Filled 2021-03-04 (×3): qty 1

## 2021-03-04 MED ORDER — METOPROLOL SUCCINATE ER 50 MG PO TB24: 50.0000 mg | ORAL_TABLET | Freq: Two times a day (BID) | ORAL | Status: DC

## 2021-03-04 MED ORDER — METOPROLOL SUCCINATE ER 25 MG PO TB24
25.0000 mg | ORAL_TABLET | Freq: Two times a day (BID) | ORAL | Status: DC
  Administered 2021-03-04 – 2021-03-05 (×3): 25 mg via ORAL
  Filled 2021-03-04 (×3): qty 1

## 2021-03-04 MED ORDER — SENNA 8.6 MG PO TABS
2.0000 | ORAL_TABLET | Freq: Every day | ORAL | Status: DC
  Administered 2021-03-04 – 2021-03-05 (×2): 17.2 mg via ORAL
  Filled 2021-03-04 (×2): qty 2

## 2021-03-04 MED ORDER — METOPROLOL TARTRATE 50 MG PO TABS
50.0000 mg | ORAL_TABLET | Freq: Two times a day (BID) | ORAL | Status: DC
  Filled 2021-03-04: qty 1

## 2021-03-04 MED ORDER — APIXABAN 5 MG PO TABS
5.0000 mg | ORAL_TABLET | Freq: Two times a day (BID) | ORAL | Status: DC
  Administered 2021-03-04 – 2021-03-05 (×3): 5 mg via ORAL
  Filled 2021-03-04 (×3): qty 1

## 2021-03-04 MED ORDER — HYDROCODONE-ACETAMINOPHEN 5-325 MG PO TABS
1.0000 | ORAL_TABLET | Freq: Four times a day (QID) | ORAL | Status: DC | PRN
  Administered 2021-03-04: 1 via ORAL
  Filled 2021-03-04: qty 1

## 2021-03-04 MED ORDER — LOSARTAN POTASSIUM 25 MG PO TABS
12.5000 mg | ORAL_TABLET | Freq: Every day | ORAL | Status: DC
  Administered 2021-03-04 – 2021-03-05 (×2): 12.5 mg via ORAL
  Filled 2021-03-04 (×2): qty 1

## 2021-03-04 NOTE — Progress Notes (Signed)
PROGRESS NOTE  Nicolas Curtis [id number] DOB: 30-Jul-1960 DOA: 03/02/2021 PCP: Sandi Mariscal, MD    Brief History:  61 year old male with a history of hypertension not on medication and peptic ulcer disease presenting with acute onset of right-sided abdominal pain, primarily right lower quadrant, that began in the early morning 03/02/2021 while the patient was having sexual intercourse.  The patient described the pain as severe and sharp.  He went to lay down after which he began having numerous episodes of nausea and vomiting without blood.  He states that he went to take some ibuprofen and lay down, but later had more emesis.  Prior to this episode, the patient had been in his usual state of health without any complaints.  Patient denies fevers, chills, headache, chest pain, dyspnea, nausea, vomiting, diarrhea, abdominal pain, dysuria, hematuria, hematochezia, and melena. The patient is a former smoker quitting 17 years ago.  Used to drink daily, but quit drinking 2 weeks ago.  He is a Administrator by trade.  He denies any illicit drugs. CT abdomen and pelvis showed mild gallbladder distention without inflammation.  There is hepatic steatosis.  There is a focal low-density in the right renal parenchyma with a wedge-shaped configuration concerning for vascular phenomena.  CTA abdomen showed mild narrowing of the celiac artery without significant hemodynamic stenosis.  There is no other acute vascular findings.  Patient was admitted for further work-up and treatment of his renal infarct. On the evening, 03/03/21, he developed afib with RVR and he was started on IV lopressor which has been transitioned to po metoprolol.  Apixaban started.   Assessment/Plan: Abdominal pain/right renal infarct -03/03/21 Echo--EF 45-50%, global HK, mild MR/TR -anti phospholipid antibody -Prothrombin gene mutation -Factor V Leiden -Urine drug screen--neg -Blood cultures x2 sets -SPEP -Judicious opioids  New  diagnosis Atrial Fibrillation, type unspecified- -likely the etiology of his renal infarct -03/03/21 Echo--EF 45-50%, global HK, mild MR/TR -TSH -CHADS-VASc = 3 (CHF, HTN, ASVD) -change IV lopressor to po metoprolol -start apixaban  Cardiomyopathy -?rate related vs ischemic -cardiology consult   Intractable nausea and vomiting -Seems to be improving -Continue IV fluids -Check lipase--48 -Gradually advance diet>>tolerating -Continue PPI   Essential hypertension -Patient not currently on any medications -started on metoprolol   GERD -Continue Protonix   CKD stage II -Baseline creatinine 1.2-1.4             Family Communication: no  Family at bedside   Consultants:  none   Code Status:  FULL    DVT Prophylaxis:   Lovenox     Procedures: As Listed in Progress Note Above   Antibiotics: None     Subjective: Patient had "anxious feeling" last night with afib with transient chest discomfort.  Denies f/c, sob, n/v/d, hematochezia  Objective: Vitals:   03/03/21 1830 03/03/21 2017 03/04/21 0011 03/04/21 0602  BP: (!) 149/103 (!) 131/92 120/89 133/77  Pulse:   94 93  Resp: 20 18 16 20   Temp: 98.2 F (36.8 C) 98.8 F (37.1 C) 99.7 F (37.6 C) 98.3 F (36.8 C)  TempSrc: Oral Oral Oral Oral  SpO2: 98% 99% 97% 98%  Weight:      Height:        Intake/Output Summary (Last 24 hours) at 03/04/2021 0728 Last data filed at 03/04/2021 0618 Gross per 24 hour  Intake 2342.52 ml  Output 3200 ml  Net -857.48 ml   Weight change:  Exam:  General:  Pt is alert, follows commands appropriately, not in acute distress HEENT: No icterus, No thrush, No neck mass, [place]/AT Cardiovascular: IRRR, S1/S2, no rubs, no gallops Respiratory: CTA bilaterally, no wheezing, no crackles, no rhonchi Abdomen: Soft/+BS, non tender, non distended, no guarding Extremities: No edema, No lymphangitis, No petechiae, No rashes, no synovitis   Data Reviewed: I have personally reviewed  following labs and imaging studies Basic Metabolic Panel: Recent Labs  Lab 03/02/21 1347 03/03/21 0422 03/04/21 0507  NA 137 137 136  K 4.1 4.0 4.1  CL 103 101 103  CO2 26 26 23   GLUCOSE 103* 105* 102*  BUN 14 13 15   CREATININE 1.29* 1.44* 1.24  CALCIUM 8.9 8.6* 8.9  MG  --   --  2.1   Liver Function Tests: Recent Labs  Lab 03/02/21 1347 03/04/21 0507  AST 22 17  ALT 40 32  ALKPHOS 48 50  BILITOT 1.0 1.2  PROT 6.8 7.1  ALBUMIN 4.1 4.0   Recent Labs  Lab 03/03/21 0828  LIPASE 48   No results for input(s): AMMONIA in the last 168 hours. Coagulation Profile: No results for input(s): INR, PROTIME in the last 168 hours. CBC: Recent Labs  Lab 03/02/21 1347 03/04/21 0507  WBC 6.9 12.8*  NEUTROABS 4.4  --   HGB 17.3* 17.8*  HCT 48.8 52.6*  MCV 91.2 90.8  PLT 210 225   Cardiac Enzymes: No results for input(s): CKTOTAL, CKMB, CKMBINDEX, TROPONINI in the last 168 hours. BNP: Invalid input(s): POCBNP CBG: No results for input(s): GLUCAP in the last 168 hours. HbA1C: No results for input(s): HGBA1C in the last 72 hours. Urine analysis:    Component Value Date/Time   COLORURINE YELLOW 03/02/2021 1300   APPEARANCEUR CLEAR 03/02/2021 1300   LABSPEC 1.006 03/02/2021 1300   PHURINE 7.0 03/02/2021 1300   GLUCOSEU NEGATIVE 03/02/2021 1300   HGBUR NEGATIVE 03/02/2021 1300   BILIRUBINUR NEGATIVE 03/02/2021 1300   KETONESUR NEGATIVE 03/02/2021 1300   PROTEINUR 30 (A) 03/02/2021 1300   UROBILINOGEN 0.2 01/30/2013 2329   NITRITE NEGATIVE 03/02/2021 1300   LEUKOCYTESUR NEGATIVE 03/02/2021 1300   Sepsis Labs: @LABRCNTIP (procalcitonin:4,lacticidven:4) ) Recent Results (from the past 240 hour(s))  Resp Panel by RT-PCR (Flu A&B, Covid) Nasopharyngeal Swab     Status: None   Collection Time: 03/02/21  3:36 PM   Specimen: Nasopharyngeal Swab; Nasopharyngeal(NP) swabs in vial transport medium  Result Value Ref Range Status   SARS Coronavirus 2 by RT PCR NEGATIVE  NEGATIVE Final    Comment: (NOTE) SARS-CoV-2 target nucleic acids are NOT DETECTED.  The SARS-CoV-2 RNA is generally detectable in upper respiratory specimens during the acute phase of infection. The lowest concentration of SARS-CoV-2 viral copies this assay can detect is 138 copies/mL. A negative result does not preclude SARS-Cov-2 infection and should not be used as the sole basis for treatment or other patient management decisions. A negative result may occur with  improper specimen collection/handling, submission of specimen other than nasopharyngeal swab, presence of viral mutation(s) within the areas targeted by this assay, and inadequate number of viral copies(<138 copies/mL). A negative result must be combined with clinical observations, patient history, and epidemiological information. The expected result is Negative.  Fact Sheet for Patients:  EntrepreneurPulse.com.au  Fact Sheet for Healthcare Providers:  IncredibleEmployment.be  This test is no t yet approved or cleared by the [place] FDA and  has been authorized for detection and/or diagnosis of SARS-CoV-2 by FDA under an Emergency Use Authorization (EUA). This EUA  will remain  in effect (meaning this test can be used) for the duration of the COVID-19 declaration under Section 564(b)(1) of the Act, 21 U.S.C.section 360bbb-3(b)(1), unless the authorization is terminated  or revoked sooner.       Influenza A by PCR NEGATIVE NEGATIVE Final   Influenza B by PCR NEGATIVE NEGATIVE Final    Comment: (NOTE) The Xpert Xpress SARS-CoV-2/FLU/RSV plus assay is intended as an aid in the diagnosis of influenza from Nasopharyngeal swab specimens and should not be used as a sole basis for treatment. Nasal washings and aspirates are unacceptable for Xpert Xpress SARS-CoV-2/FLU/RSV testing.  Fact Sheet for Patients: EntrepreneurPulse.com.au  Fact Sheet for Healthcare  Providers: IncredibleEmployment.be  This test is not yet approved or cleared by the [place] FDA and has been authorized for detection and/or diagnosis of SARS-CoV-2 by FDA under an Emergency Use Authorization (EUA). This EUA will remain in effect (meaning this test can be used) for the duration of the COVID-19 declaration under Section 564(b)(1) of the Act, 21 U.S.C. section 360bbb-3(b)(1), unless the authorization is terminated or revoked.  Performed at [facility], [address] [place], [place] [address]   Culture, blood (routine x 2)     Status: None (Preliminary result)   Collection Time: 03/03/21  8:27 AM   Specimen: Right Antecubital; Blood  Result Value Ref Range Status   Specimen Description RIGHT ANTECUBITAL  Final   Special Requests   Final    BOTTLES DRAWN AEROBIC AND ANAEROBIC Blood Culture adequate volume   Culture   Final    NO GROWTH < 12 HOURS Performed at [facility], [address] [place], [place] [address]    Report Status PENDING  Incomplete  Culture, blood (routine x 2)     Status: None (Preliminary result)   Collection Time: 03/03/21  8:30 AM   Specimen: BLOOD RIGHT WRIST  Result Value Ref Range Status   Specimen Description BLOOD RIGHT WRIST  Final   Special Requests   Final    BOTTLES DRAWN AEROBIC AND ANAEROBIC Blood Culture adequate volume   Culture   Final    NO GROWTH < 12 HOURS Performed at [facility], [address] [place], [place] [address]    Report Status PENDING  Incomplete     Scheduled Meds:  aspirin  325 mg Oral Daily   enoxaparin (LOVENOX) injection  50 mg Subcutaneous Q24H   metoprolol tartrate  5 mg Intravenous Q6H   pantoprazole  40 mg Oral Daily   polyethylene glycol  17 g Oral Daily   Continuous Infusions:  lactated ringers 75 mL/hr at 03/03/21 2037    Procedures/Studies: CT ANGIO ABDOMEN W &/OR WO CONTRAST  Result Date: 03/02/2021 CLINICAL DATA:  Renal ischemia or infarction.  Abnormal CT  scan. EXAM: CT ANGIOGRAPHY ABDOMEN TECHNIQUE: Multidetector CT imaging of the abdomen was performed using the standard protocol during bolus administration of intravenous contrast. Multiplanar reconstructed images and MIPs were obtained and reviewed to evaluate the vascular anatomy. CONTRAST:  22mL OMNIPAQUE IOHEXOL 350 MG/ML SOLN COMPARISON:  CT of the abdomen and pelvis 03/02/21 at 2:55. FINDINGS: VASCULAR Aorta: Atherosclerotic irregularities are present without aneurysm or stenosis. Celiac: Mild narrowing present at the origin without significant stenosis relative to the more distal vessel. Branch vessels are within normal limits. SMA: Patent without evidence of aneurysm, dissection, vasculitis or significant stenosis. Renals: Both renal arteries are patent without evidence of aneurysm, dissection, vasculitis, fibromuscular dysplasia or significant stenosis. IMA: Patent without evidence of aneurysm,  dissection, vasculitis or significant stenosis. Inflow: Patent without evidence of aneurysm, dissection, vasculitis or significant stenosis. Veins: No obvious venous abnormality within the limitations of this arterial phase study. Review of the MIP images confirms the above findings. NON-VASCULAR Lower chest: The lung bases are clear.  Heart size is normal. Hepatobiliary: Calcified gallstones again noted without inflammatory change. Diffuse fatty infiltration of the liver noted. No focal hepatic lesions present. Pancreas: Unremarkable. No pancreatic ductal dilatation or surrounding inflammatory changes. Spleen: Normal in size without focal abnormality. Adrenals/Urinary Tract: The area of hypoattenuation in the posterior right kidney is unchanged. No other mass lesion or stone is present. No obstruction present. Central sinus cyst again noted on the left. Ureters are within normal limits. Stomach/Bowel: Colonic diverticula are present. Visualized bowel is otherwise within normal limits. Lymphatic: No significant  adenopathy is present. Other: No abdominal wall hernia or abnormality. No abdominopelvic ascites. Musculoskeletal: No acute or significant osseous findings. IMPRESSION: 1. 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 2. Atherosclerotic changes of the abdominal aorta without aneurysm or stenosis. Aortic Atherosclerosis (ICD10-I70.0). 3. Stable appearance of low-density in the posterior right kidney. This likely represents a focal infarct as previously described. 4. Hepatic steatosis. 5. Cholelithiasis without cholecystitis. 6. Colonic diverticulosis without diverticulitis. Electronically Signed   By: San Morelle M.D.   On: 03/02/2021 18:19   CT ABDOMEN PELVIS W CONTRAST  Result Date: 03/02/2021 CLINICAL DATA:  Right lower quadrant abdominal pain. EXAM: CT ABDOMEN AND PELVIS WITH CONTRAST TECHNIQUE: Multidetector CT imaging of the abdomen and pelvis was performed using the standard protocol following bolus administration of intravenous contrast. CONTRAST:  37mL OMNIPAQUE IOHEXOL 350 MG/ML SOLN COMPARISON:  01/31/2013 FINDINGS: Lower chest: Lung bases are clear. Hepatobiliary: Calcified gallstones. Mild gallbladder distension without surrounding inflammatory changes. Decreased attenuation in the liver is suggestive for at least mild steatosis. No focal liver lesion. No biliary dilatation. Pancreas: Unremarkable. No pancreatic ductal dilatation or surrounding inflammatory changes. Spleen: Normal in size without focal abnormality. Adrenals/Urinary Tract: Normal adrenal glands. Left renal sinus cyst measuring up to 1.7 cm. Negative for left hydronephrosis. Focal low-density in the right kidney interpolar parenchyma. The low-density has a slightly wedge-shaped configuration and portions of this low-density have well-defined borders suggesting a vascular distribution. Minimal right perinephric stranding. Normal appearance of the urinary  bladder. No ureter dilatation. Stomach/Bowel: Colonic diverticula. No evidence for acute bowel inflammation. Normal appendix. Normal stomach. Vascular/Lymphatic: Mild atherosclerotic disease in the abdominal aorta and iliac arteries. Negative for an aortic aneurysm or dissection. Main visceral arteries are patent. Evidence for small bilateral accessory renal arteries. Mild fat stranding in the central abdominal mesentery appears similar to the exam from 2014. Slightly prominent lymph nodes in the central abdominal mesentery are also similar to prior examination. No significant lymph node enlargement in the abdomen or pelvis. Reproductive: Prostate is unremarkable. Other: Negative for ascites. Negative for free air. Small umbilical hernia containing fat. Musculoskeletal: No acute bone abnormality. IMPRESSION: 1. Focal low-density in the right kidney interpolar region. Differential diagnosis includes renal infarct versus focal pyelonephritis. No significant right perinephric stranding. Based on the morphology of the low-density area, favor a renal infarct. 2. Cholelithiasis without evidence for gallbladder inflammation. 3. Slightly decreased attenuation of the liver which could represent steatosis. 4.  Aortic Atherosclerosis (ICD10-I70.0). These results were called by telephone at the time of interpretation on 03/02/2021 at 3:21 pm to provider [facility] , who verbally acknowledged these results. Electronically  Signed   By: Markus Daft M.D.   On: 03/02/2021 15:26   US Venous Img Lower Bilateral (DVT)  Result Date: 03/03/2021 CLINICAL DATA:  Right renal infarct. EXAM: BILATERAL LOWER EXTREMITY VENOUS DOPPLER ULTRASOUND TECHNIQUE: Gray-scale sonography with graded compression, as well as color Doppler and duplex ultrasound were performed to evaluate the lower extremity deep venous systems from the level of the common femoral vein and including the common femoral, femoral, profunda femoral, popliteal and calf veins  including the posterior tibial, peroneal and gastrocnemius veins when visible. The superficial great saphenous vein was also interrogated. Spectral Doppler was utilized to evaluate flow at rest and with distal augmentation maneuvers in the common femoral, femoral and popliteal veins. COMPARISON:  None. FINDINGS: RIGHT LOWER EXTREMITY Common Femoral Vein: No evidence of thrombus. Normal compressibility, respiratory phasicity and response to augmentation. Saphenofemoral Junction: No evidence of thrombus. Normal compressibility and flow on color Doppler imaging. Profunda Femoral Vein: No evidence of thrombus. Normal compressibility and flow on color Doppler imaging. Femoral Vein: No evidence of thrombus. Normal compressibility, respiratory phasicity and response to augmentation. Popliteal Vein: No evidence of thrombus. Normal compressibility, respiratory phasicity and response to augmentation. Calf Veins: No evidence of thrombus. Normal compressibility and flow on color Doppler imaging. LEFT LOWER EXTREMITY Common Femoral Vein: No evidence of thrombus. Normal compressibility, respiratory phasicity and response to augmentation. Saphenofemoral Junction: No evidence of thrombus. Normal compressibility and flow on color Doppler imaging. Profunda Femoral Vein: No evidence of thrombus. Normal compressibility and flow on color Doppler imaging. Femoral Vein: No evidence of thrombus. Normal compressibility, respiratory phasicity and response to augmentation. Popliteal Vein: No evidence of thrombus. Normal compressibility, respiratory phasicity and response to augmentation. Calf Veins: No evidence of thrombus. Normal compressibility and flow on color Doppler imaging. IMPRESSION: No evidence of deep venous thrombosis in either lower extremity. Electronically Signed   By: Markus Daft M.D.   On: 03/03/2021 11:03   ECHOCARDIOGRAM COMPLETE  Result Date: 03/03/2021    ECHOCARDIOGRAM REPORT   Patient Name:   Nicolas Curtis Date of Exam:  03/03/2021 Medical Rec #:  [id number]   Height:       71.0 in Accession #:    [id number]  Weight:       219.6 lb Date of Birth:  Apr 06, 1960   BSA:          2.194 m Patient Age:    21 years    BP:           129/77 mmHg Patient Gender: M           HR:           66 bpm. Exam Location:  [facility] Procedure: 2D Echo, Cardiac Doppler and Color Doppler Indications:    Evaluation for source of emboli  History:        Patient has no prior history of Echocardiogram examinations.                 CKD.  Sonographer:    Alvino Chapel RCS Referring Phys: [phone] Maziah Keeling IMPRESSIONS  1. Left ventricular ejection fraction, by estimation, is 45 to 50%. The left ventricle has low normal to mildly decreased function. The left ventricle demonstrates global hypokinesis. Left ventricular diastolic parameters were normal.  2. Right ventricular systolic function is normal. The right ventricular size is normal. There is normal pulmonary artery systolic pressure.  3. The mitral valve is normal in structure. Mild mitral valve regurgitation. No evidence of mitral stenosis.  4. The tricuspid valve is abnormal.  5. The aortic valve is tricuspid. Aortic valve regurgitation is not visualized. No aortic stenosis is present.  6. The inferior vena cava is normal in size with greater than 50% respiratory variability, suggesting right atrial pressure of 3 mmHg. FINDINGS  Left Ventricle: Left ventricular ejection fraction, by estimation, is 45 to 50%. The left ventricle has low normal to mildly decreased function. The left ventricle demonstrates global hypokinesis. Definity contrast agent was given IV to delineate the left ventricular endocardial borders. The left ventricular internal cavity size was normal in size. There is no left ventricular hypertrophy. Left ventricular diastolic parameters were normal. Right Ventricle: The right ventricular size is normal. No increase in right ventricular wall thickness. Right ventricular systolic function is normal.  There is normal pulmonary artery systolic pressure. The tricuspid regurgitant velocity is 2.66 m/s, and  with an assumed right atrial pressure of 3 mmHg, the estimated right ventricular systolic pressure is 123456 mmHg. Left Atrium: Left atrial size was normal in size. Right Atrium: Right atrial size was normal in size. Pericardium: There is no evidence of pericardial effusion. Mitral Valve: The mitral valve is normal in structure. Mild mitral valve regurgitation. No evidence of mitral valve stenosis. Tricuspid Valve: The tricuspid valve is abnormal. Tricuspid valve regurgitation is mild . No evidence of tricuspid stenosis. Aortic Valve: The aortic valve is tricuspid. Aortic valve regurgitation is not visualized. No aortic stenosis is present. Aortic valve mean gradient measures 3.9 mmHg. Aortic valve peak gradient measures 6.9 mmHg. Aortic valve area, by VTI measures 1.78 cm. Pulmonic Valve: The pulmonic valve was not well visualized. Pulmonic valve regurgitation is not visualized. No evidence of pulmonic stenosis. Aorta: The aortic root is normal in size and structure. Venous: The inferior vena cava is normal in size with greater than 50% respiratory variability, suggesting right atrial pressure of 3 mmHg. IAS/Shunts: No atrial level shunt detected by color flow Doppler.  LEFT VENTRICLE PLAX 2D LVIDd:         5.00 cm   Diastology LVIDs:         3.90 cm   LV e' medial:    7.72 cm/s LV PW:         0.90 cm   LV E/e' medial:  12.2 LV IVS:        0.80 cm   LV e' lateral:   10.70 cm/s LVOT diam:     1.90 cm   LV E/e' lateral: 8.8 LV SV:         50 LV SV Index:   23 LVOT Area:     2.84 cm  RIGHT VENTRICLE RV S prime:     15.70 cm/s TAPSE (M-mode): 2.3 cm LEFT ATRIUM             Index        RIGHT ATRIUM           Index LA diam:        4.30 cm 1.96 cm/m   RA Area:     16.40 cm LA Vol (A2C):   76.0 ml 34.64 ml/m  RA Volume:   40.40 ml  18.41 ml/m LA Vol (A4C):   56.7 ml 25.84 ml/m LA Biplane Vol: 68.5 ml 31.22 ml/m   AORTIC VALVE AV Area (Vmax):    1.78 cm AV Area (Vmean):   1.83 cm AV Area (VTI):     1.78 cm AV Vmax:  130.92 cm/s AV Vmean:          92.017 cm/s AV VTI:            0.282 m AV Peak Grad:      6.9 mmHg AV Mean Grad:      3.9 mmHg LVOT Vmax:         82.40 cm/s LVOT Vmean:        59.500 cm/s LVOT VTI:          0.177 m LVOT/AV VTI ratio: 0.63  AORTA Ao Root diam: 3.40 cm MITRAL VALVE               TRICUSPID VALVE MV Area (PHT): 3.91 cm    TR Peak grad:   28.3 mmHg MV Decel Time: 194 msec    TR Vmax:        266.00 cm/s MR Peak grad: 113.2 mmHg MR Mean grad: 77.0 mmHg    SHUNTS MR Vmax:      532.00 cm/s  Systemic VTI:  0.18 m MR Vmean:     413.0 cm/s   Systemic Diam: 1.90 cm MV E velocity: 94.40 cm/s MV A velocity: 88.30 cm/s MV E/A ratio:  1.07 Carlyle Dolly MD Electronically signed by Carlyle Dolly MD Signature Date/Time: 03/03/2021/1:21:46 PM    Final     Orson Eva, DO  [facility]  If 7PM-7AM, please contact night-coverage [email or web address] Password [email or web address] 03/04/2021, 7:28 AM   LOS: 1 day

## 2021-03-04 NOTE — Progress Notes (Signed)
°   03/03/21 2017  Assess: MEWS Score  Temp 98.8 F (37.1 C)  BP (!) 131/92  ECG Heart Rate 95  Resp 18  SpO2 99 %  O2 Device Room Air  Assess: MEWS Score  MEWS Temp 0  MEWS Systolic 0  MEWS Pulse 0  MEWS RR 0  MEWS LOC 0  MEWS Score 0  MEWS Score Color Green  Assess: if the MEWS score is Yellow or Red  Were vital signs taken at a resting state? Yes  Early Detection of Sepsis Score *See Row Information* Low  Document  Patient Outcome Other (Comment) (giving scheduled IV metoprolol.)  Progress note created (see row info) Yes

## 2021-03-04 NOTE — Consult Note (Addendum)
Cardiology Consultation:   Patient ID: Nicolas Curtis MRN: [id number]; DOB: September 25, 1960  Admit date: 03/02/2021 Date of Consult: 03/04/2021  PCP:  Sandi Mariscal, MD   [facility] Providers Cardiologist:  New, Dr Myles Gip     Patient Profile:   Nicolas Curtis is a 61 y.o. male with a hx of PUD who is being seen 03/04/2021 for the evaluation of new diagnosis of afib and mild LV dysfunction at the request of Dr Tat.  History of Present Illness:   Nicolas Curtis 61yo male no prior cardiac history admitted with sudden onset right flank pain along with N/V. Diagnosed with right splenic infarct by CT imaging. As part of work up EKG has shown new diagnosis of afib and echo has shown low normal to mildly decreased LVEF, cardiology consulted to help manage.  He denies any palpitations at home. Denies any SOB/DOE, no LE edema.     Admit labs WBC 6.9 Hgb 17.3 Plt 210 K4.1 Cr 1.29 COVID neg Flu neg EKG afib RVR Echo LVEF 45-50%, normal diastoilc function, normal RV, mild MR   CT A/P: 1. Focal low-density in the right kidney interpolar region. Differential diagnosis includes renal infarct versus focal pyelonephritis. No significant right perinephric stranding. Based on the morphology of the low-density area, favor a renal infarct  LE venous US: no DVT   Past Medical History:  Diagnosis Date   History of stomach ulcers    Ruptured disc, thoracic     Past Surgical History:  Procedure Laterality Date   COLONOSCOPY     ESOPHAGOGASTRODUODENOSCOPY     FOOT SURGERY Right    spacer placed between first and second digits   gastric ulcer     scoliosis        Inpatient Medications: Scheduled Meds:  apixaban  5 mg Oral BID   metoprolol tartrate  50 mg Oral BID   pantoprazole  40 mg Oral Daily   polyethylene glycol  17 g Oral Daily   senna  2 tablet Oral Daily   Continuous Infusions:  lactated ringers 75 mL/hr at 03/03/21 2037   PRN Meds: HYDROcodone-acetaminophen, zolpidem  Allergies:     Allergies  Allergen Reactions   Codeine Rash   Oxycodone Rash    Social History:   Social History   Socioeconomic History   Marital status: Married    Spouse name: Not on file   Number of children: Not on file   Years of education: Not on file   Highest education level: Not on file  Occupational History   Not on file  Tobacco Use   Smoking status: Former   Smokeless tobacco: Never  Vaping Use   Vaping Use: Never used  Substance and Sexual Activity   Alcohol use: Yes    Comment: occasional   Drug use: No   Sexual activity: Not on file  Other Topics Concern   Not on file  Social History Narrative   Not on file   Social Determinants of Health   Financial Resource Strain: Not on file  Food Insecurity: Not on file  Transportation Needs: Not on file  Physical Activity: Not on file  Stress: Not on file  Social Connections: Not on file  Intimate Partner Violence: Not on file    Family History:   No family history on file.   ROS:  Please see the history of present illness.   All other ROS reviewed and negative.     Physical Exam/Data:   Vitals:   03/03/21 1830  03/03/21 2017 03/04/21 0011 03/04/21 0602  BP: (!) 149/103 (!) 131/92 120/89 133/77  Pulse:   94 93  Resp: 20 18 16 20   Temp: 98.2 F (36.8 C) 98.8 F (37.1 C) 99.7 F (37.6 C) 98.3 F (36.8 C)  TempSrc: Oral Oral Oral Oral  SpO2: 98% 99% 97% 98%  Weight:      Height:        Intake/Output Summary (Last 24 hours) at 03/04/2021 0825 Last data filed at 03/04/2021 0618 Gross per 24 hour  Intake 2342.52 ml  Output 3200 ml  Net -857.48 ml   Last 3 Weights 03/03/2021 03/02/2021 06/09/2013  Weight (lbs) 219 lb 9.3 oz 236 lb 226 lb  Weight (kg) 99.6 kg 107.049 kg 102.513 kg     Body mass index is 30.62 kg/m.  General:  Well nourished, well developed, in no acute distress HEENT: normal Neck: no JVD Vascular: No carotid bruits; Distal pulses 2+ bilaterally Cardiac: irreg, tachy, no m/r/g Lungs:  clear  to auscultation bilaterally, no wheezing, rhonchi or rales  Abd: soft, nontender, no hepatomegaly  Ext: no edema Musculoskeletal:  No deformities, BUE and BLE strength normal and equal Skin: warm and dry  Neuro:  CNs 2-12 intact, no focal abnormalities noted Psych:  Normal affect     Laboratory Data:  High Sensitivity Troponin:  No results for input(s): TROPONINIHS in the last 720 hours.   Chemistry Recent Labs  Lab 03/02/21 1347 03/03/21 0422 03/04/21 0507  NA 137 137 136  K 4.1 4.0 4.1  CL 103 101 103  CO2 26 26 23   GLUCOSE 103* 105* 102*  BUN 14 13 15   CREATININE 1.29* 1.44* 1.24  CALCIUM 8.9 8.6* 8.9  MG  --   --  2.1  GFRNONAA >60 56* >60  ANIONGAP 8 10 10     Recent Labs  Lab 03/02/21 1347 03/04/21 0507  PROT 6.8 7.1  ALBUMIN 4.1 4.0  AST 22 17  ALT 40 32  ALKPHOS 48 50  BILITOT 1.0 1.2   Lipids No results for input(s): CHOL, TRIG, HDL, LABVLDL, LDLCALC, CHOLHDL in the last 168 hours.  Hematology Recent Labs  Lab 03/02/21 1347 03/04/21 0507  WBC 6.9 12.8*  RBC 5.35 5.79  HGB 17.3* 17.8*  HCT 48.8 52.6*  MCV 91.2 90.8  MCH 32.3 30.7  MCHC 35.5 33.8  RDW 12.6 12.5  PLT 210 225   Thyroid No results for input(s): TSH, FREET4 in the last 168 hours.  BNPNo results for input(s): BNP, PROBNP in the last 168 hours.  DDimer No results for input(s): DDIMER in the last 168 hours.   Radiology/Studies:  CT ANGIO ABDOMEN W &/OR WO CONTRAST  Result Date: 03/02/2021 CLINICAL DATA:  Renal ischemia or infarction.  Abnormal CT scan. EXAM: CT ANGIOGRAPHY ABDOMEN TECHNIQUE: Multidetector CT imaging of the abdomen was performed using the Curtis protocol during bolus administration of intravenous contrast. Multiplanar reconstructed images and MIPs were obtained and reviewed to evaluate the vascular anatomy. CONTRAST:  49mL OMNIPAQUE IOHEXOL 350 MG/ML SOLN COMPARISON:  CT of the abdomen and pelvis 03/02/21 at 2:55. FINDINGS: VASCULAR Aorta: Atherosclerotic irregularities  are present without aneurysm or stenosis. Celiac: Mild narrowing present at the origin without significant stenosis relative to the more distal vessel. Selmer Adduci vessels are within normal limits. SMA: Patent without evidence of aneurysm, dissection, vasculitis or significant stenosis. Renals: Both renal arteries are patent without evidence of aneurysm, dissection, vasculitis, fibromuscular dysplasia or significant stenosis. IMA: Patent without evidence of aneurysm, dissection,  vasculitis or significant stenosis. Inflow: Patent without evidence of aneurysm, dissection, vasculitis or significant stenosis. Veins: No obvious venous abnormality within the limitations of this arterial phase study. Review of the MIP images confirms the above findings. NON-VASCULAR Lower chest: The lung bases are clear.  Heart size is normal. Hepatobiliary: Calcified gallstones again noted without inflammatory change. Diffuse fatty infiltration of the liver noted. No focal hepatic lesions present. Pancreas: Unremarkable. No pancreatic ductal dilatation or surrounding inflammatory changes. Spleen: Normal in size without focal abnormality. Adrenals/Urinary Tract: The area of hypoattenuation in the posterior right kidney is unchanged. No other mass lesion or stone is present. No obstruction present. Central sinus cyst again noted on the left. Ureters are within normal limits. Stomach/Bowel: Colonic diverticula are present. Visualized bowel is otherwise within normal limits. Lymphatic: No significant adenopathy is present. Other: No abdominal wall hernia or abnormality. No abdominopelvic ascites. Musculoskeletal: No acute or significant osseous findings. IMPRESSION: 1. 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 2. Atherosclerotic changes of the abdominal aorta without aneurysm or stenosis. Aortic Atherosclerosis (ICD10-I70.0). 3. Stable appearance of low-density in the  posterior right kidney. This likely represents a focal infarct as previously described. 4. Hepatic steatosis. 5. Cholelithiasis without cholecystitis. 6. Colonic diverticulosis without diverticulitis. Electronically Signed   By: San Morelle M.D.   On: 03/02/2021 18:19   CT ABDOMEN PELVIS W CONTRAST  Result Date: 03/02/2021 CLINICAL DATA:  Right lower quadrant abdominal pain. EXAM: CT ABDOMEN AND PELVIS WITH CONTRAST TECHNIQUE: Multidetector CT imaging of the abdomen and pelvis was performed using the Curtis protocol following bolus administration of intravenous contrast. CONTRAST:  51mL OMNIPAQUE IOHEXOL 350 MG/ML SOLN COMPARISON:  01/31/2013 FINDINGS: Lower chest: Lung bases are clear. Hepatobiliary: Calcified gallstones. Mild gallbladder distension without surrounding inflammatory changes. Decreased attenuation in the liver is suggestive for at least mild steatosis. No focal liver lesion. No biliary dilatation. Pancreas: Unremarkable. No pancreatic ductal dilatation or surrounding inflammatory changes. Spleen: Normal in size without focal abnormality. Adrenals/Urinary Tract: Normal adrenal glands. Left renal sinus cyst measuring up to 1.7 cm. Negative for left hydronephrosis. Focal low-density in the right kidney interpolar parenchyma. The low-density has a slightly wedge-shaped configuration and portions of this low-density have well-defined borders suggesting a vascular distribution. Minimal right perinephric stranding. Normal appearance of the urinary bladder. No ureter dilatation. Stomach/Bowel: Colonic diverticula. No evidence for acute bowel inflammation. Normal appendix. Normal stomach. Vascular/Lymphatic: Mild atherosclerotic disease in the abdominal aorta and iliac arteries. Negative for an aortic aneurysm or dissection. Main visceral arteries are patent. Evidence for small bilateral accessory renal arteries. Mild fat stranding in the central abdominal mesentery appears similar to the exam  from 2014. Slightly prominent lymph nodes in the central abdominal mesentery are also similar to prior examination. No significant lymph node enlargement in the abdomen or pelvis. Reproductive: Prostate is unremarkable. Other: Negative for ascites. Negative for free air. Small umbilical hernia containing fat. Musculoskeletal: No acute bone abnormality. IMPRESSION: 1. Focal low-density in the right kidney interpolar region. Differential diagnosis includes renal infarct versus focal pyelonephritis. No significant right perinephric stranding. Based on the morphology of the low-density area, favor a renal infarct. 2. Cholelithiasis without evidence for gallbladder inflammation. 3. Slightly decreased attenuation of the liver which could represent steatosis. 4.  Aortic Atherosclerosis (ICD10-I70.0). These results were called by telephone at the time of interpretation on 03/02/2021 at 3:21 pm to provider [facility] , who verbally acknowledged these results. Electronically Signed  By: Markus Daft M.D.   On: 03/02/2021 15:26   US Venous Img Lower Bilateral (DVT)  Result Date: 03/03/2021 CLINICAL DATA:  Right renal infarct. EXAM: BILATERAL LOWER EXTREMITY VENOUS DOPPLER ULTRASOUND TECHNIQUE: Gray-scale sonography with graded compression, as well as color Doppler and duplex ultrasound were performed to evaluate the lower extremity deep venous systems from the level of the common femoral vein and including the common femoral, femoral, profunda femoral, popliteal and calf veins including the posterior tibial, peroneal and gastrocnemius veins when visible. The superficial great saphenous vein was also interrogated. Spectral Doppler was utilized to evaluate flow at rest and with distal augmentation maneuvers in the common femoral, femoral and popliteal veins. COMPARISON:  None. FINDINGS: RIGHT LOWER EXTREMITY Common Femoral Vein: No evidence of thrombus. Normal compressibility, respiratory phasicity and response to  augmentation. Saphenofemoral Junction: No evidence of thrombus. Normal compressibility and flow on color Doppler imaging. Profunda Femoral Vein: No evidence of thrombus. Normal compressibility and flow on color Doppler imaging. Femoral Vein: No evidence of thrombus. Normal compressibility, respiratory phasicity and response to augmentation. Popliteal Vein: No evidence of thrombus. Normal compressibility, respiratory phasicity and response to augmentation. Calf Veins: No evidence of thrombus. Normal compressibility and flow on color Doppler imaging. LEFT LOWER EXTREMITY Common Femoral Vein: No evidence of thrombus. Normal compressibility, respiratory phasicity and response to augmentation. Saphenofemoral Junction: No evidence of thrombus. Normal compressibility and flow on color Doppler imaging. Profunda Femoral Vein: No evidence of thrombus. Normal compressibility and flow on color Doppler imaging. Femoral Vein: No evidence of thrombus. Normal compressibility, respiratory phasicity and response to augmentation. Popliteal Vein: No evidence of thrombus. Normal compressibility, respiratory phasicity and response to augmentation. Calf Veins: No evidence of thrombus. Normal compressibility and flow on color Doppler imaging. IMPRESSION: No evidence of deep venous thrombosis in either lower extremity. Electronically Signed   By: Markus Daft M.D.   On: 03/03/2021 11:03   ECHOCARDIOGRAM COMPLETE  Result Date: 03/03/2021    ECHOCARDIOGRAM REPORT   Patient Name:   Nicolas Curtis Date of Exam: 03/03/2021 Medical Rec #:  [id number]   Height:       71.0 in Accession #:    [id number]  Weight:       219.6 lb Date of Birth:  23-Dec-1960   BSA:          2.194 m Patient Age:    15 years    BP:           129/77 mmHg Patient Gender: M           HR:           66 bpm. Exam Location:  [facility] Procedure: 2D Echo, Cardiac Doppler and Color Doppler Indications:    Evaluation for source of emboli  History:        Patient has no prior history of  Echocardiogram examinations.                 CKD.  Sonographer:    Alvino Chapel RCS Referring Phys: [phone] DAVID TAT IMPRESSIONS  1. Left ventricular ejection fraction, by estimation, is 45 to 50%. The left ventricle has low normal to mildly decreased function. The left ventricle demonstrates global hypokinesis. Left ventricular diastolic parameters were normal.  2. Right ventricular systolic function is normal. The right ventricular size is normal. There is normal pulmonary artery systolic pressure.  3. The mitral valve is normal in structure. Mild mitral valve regurgitation. No evidence of mitral stenosis.  4. The tricuspid  valve is abnormal.  5. The aortic valve is tricuspid. Aortic valve regurgitation is not visualized. No aortic stenosis is present.  6. The inferior vena cava is normal in size with greater than 50% respiratory variability, suggesting right atrial pressure of 3 mmHg. FINDINGS  Left Ventricle: Left ventricular ejection fraction, by estimation, is 45 to 50%. The left ventricle has low normal to mildly decreased function. The left ventricle demonstrates global hypokinesis. Definity contrast agent was given IV to delineate the left ventricular endocardial borders. The left ventricular internal cavity size was normal in size. There is no left ventricular hypertrophy. Left ventricular diastolic parameters were normal. Right Ventricle: The right ventricular size is normal. No increase in right ventricular wall thickness. Right ventricular systolic function is normal. There is normal pulmonary artery systolic pressure. The tricuspid regurgitant velocity is 2.66 m/s, and  with an assumed right atrial pressure of 3 mmHg, the estimated right ventricular systolic pressure is 123456 mmHg. Left Atrium: Left atrial size was normal in size. Right Atrium: Right atrial size was normal in size. Pericardium: There is no evidence of pericardial effusion. Mitral Valve: The mitral valve is normal in structure. Mild  mitral valve regurgitation. No evidence of mitral valve stenosis. Tricuspid Valve: The tricuspid valve is abnormal. Tricuspid valve regurgitation is mild . No evidence of tricuspid stenosis. Aortic Valve: The aortic valve is tricuspid. Aortic valve regurgitation is not visualized. No aortic stenosis is present. Aortic valve mean gradient measures 3.9 mmHg. Aortic valve peak gradient measures 6.9 mmHg. Aortic valve area, by VTI measures 1.78 cm. Pulmonic Valve: The pulmonic valve was not well visualized. Pulmonic valve regurgitation is not visualized. No evidence of pulmonic stenosis. Aorta: The aortic root is normal in size and structure. Venous: The inferior vena cava is normal in size with greater than 50% respiratory variability, suggesting right atrial pressure of 3 mmHg. IAS/Shunts: No atrial level shunt detected by color flow Doppler.  LEFT VENTRICLE PLAX 2D LVIDd:         5.00 cm   Diastology LVIDs:         3.90 cm   LV e' medial:    7.72 cm/s LV PW:         0.90 cm   LV E/e' medial:  12.2 LV IVS:        0.80 cm   LV e' lateral:   10.70 cm/s LVOT diam:     1.90 cm   LV E/e' lateral: 8.8 LV SV:         50 LV SV Index:   23 LVOT Area:     2.84 cm  RIGHT VENTRICLE RV S prime:     15.70 cm/s TAPSE (M-mode): 2.3 cm LEFT ATRIUM             Index        RIGHT ATRIUM           Index LA diam:        4.30 cm 1.96 cm/m   RA Area:     16.40 cm LA Vol (A2C):   76.0 ml 34.64 ml/m  RA Volume:   40.40 ml  18.41 ml/m LA Vol (A4C):   56.7 ml 25.84 ml/m LA Biplane Vol: 68.5 ml 31.22 ml/m  AORTIC VALVE AV Area (Vmax):    1.78 cm AV Area (Vmean):   1.83 cm AV Area (VTI):     1.78 cm AV Vmax:           130.92 cm/s AV Vmean:  92.017 cm/s AV VTI:            0.282 m AV Peak Grad:      6.9 mmHg AV Mean Grad:      3.9 mmHg LVOT Vmax:         82.40 cm/s LVOT Vmean:        59.500 cm/s LVOT VTI:          0.177 m LVOT/AV VTI ratio: 0.63  AORTA Ao Root diam: 3.40 cm MITRAL VALVE               TRICUSPID VALVE MV Area  (PHT): 3.91 cm    TR Peak grad:   28.3 mmHg MV Decel Time: 194 msec    TR Vmax:        266.00 cm/s MR Peak grad: 113.2 mmHg MR Mean grad: 77.0 mmHg    SHUNTS MR Vmax:      532.00 cm/s  Systemic VTI:  0.18 m MR Vmean:     413.0 cm/s   Systemic Diam: 1.90 cm MV E velocity: 94.40 cm/s MV A velocity: 88.30 cm/s MV E/A ratio:  1.07 Carlyle Dolly MD Electronically signed by Carlyle Dolly MD Signature Date/Time: 03/03/2021/1:21:46 PM    Final      Assessment and Plan:   1.Afib - new diagnosis this admission - patient presented with renal infarct, certaintly may have been related to afib and embolic event - he has been started on eliquis 5mg  bid, continue. CHADS2Vasc score is (HTN-based on vitals during admit, embolic event, HF with mild LV dysfunction) is 4 - rates remain elevated, start toprol 25mg  bid.  - would plan for DCCV after 3 weeks on anticoag as outpatient if remains in afib  2. Low normal to mildly decreased LV systolic function - may be related to new diagnosis of afib and elevated rates - start toprol 25 mg bid. Add losartan 12.5mg  daily.   3.Aortic atherosclerosis - noted on CT imaging -add lipid panel to labs   For questions or updates, please contact [place] Please consult [email or web address] for contact info under    Signed, Carlyle Dolly, MD  03/04/2021 8:25 AM

## 2021-03-04 NOTE — Progress Notes (Signed)
C/O constipation , received order for miralax daily.States hasn't had bm in 3 days and usually goes daily

## 2021-03-05 ENCOUNTER — Other Ambulatory Visit: Payer: Self-pay | Admitting: Student

## 2021-03-05 DIAGNOSIS — N289 Disorder of kidney and ureter, unspecified: Secondary | ICD-10-CM

## 2021-03-05 DIAGNOSIS — Z79899 Other long term (current) drug therapy: Secondary | ICD-10-CM

## 2021-03-05 LAB — BASIC METABOLIC PANEL WITH GFR
Anion gap: 9 (ref 5–15)
BUN: 18 mg/dL (ref 6–20)
CO2: 28 mmol/L (ref 22–32)
Calcium: 9.3 mg/dL (ref 8.9–10.3)
Chloride: 102 mmol/L (ref 98–111)
Creatinine, Ser: 1.53 mg/dL — ABNORMAL HIGH (ref 0.61–1.24)
GFR, Estimated: 52 mL/min — ABNORMAL LOW
Glucose, Bld: 93 mg/dL (ref 70–99)
Potassium: 4.6 mmol/L (ref 3.5–5.1)
Sodium: 139 mmol/L (ref 135–145)

## 2021-03-05 LAB — ANTIPHOSPHOLIPID SYNDROME EVAL, BLD
Anticardiolipin IgA: <9 U/mL (ref 0–11)
Anticardiolipin IgG: <9 GPL U/mL (ref 0–14)
Anticardiolipin IgM: <9 [MPL'U]/mL (ref 0–12)
DRVVT: 37.9 s (ref 0.0–47.0)
PTT Lupus Anticoagulant: 30.1 s (ref 0.0–51.9)
Phosphatydalserine, IgA: <1 {APS'U} (ref 0–19)
Phosphatydalserine, IgG: 18 U (ref 0–30)
Phosphatydalserine, IgM: <10 U (ref 0–30)

## 2021-03-05 LAB — PROTEIN ELECTROPHORESIS, SERUM
A/G Ratio: 1.7 (ref 0.7–1.7)
Albumin ELP: 3.9 g/dL (ref 2.9–4.4)
Alpha-1-Globulin: 0.1 g/dL (ref 0.0–0.4)
Alpha-2-Globulin: 0.6 g/dL (ref 0.4–1.0)
Beta Globulin: 0.9 g/dL (ref 0.7–1.3)
Gamma Globulin: 0.6 g/dL (ref 0.4–1.8)
Globulin, Total: 2.3 g/dL (ref 2.2–3.9)
Total Protein ELP: 6.2 g/dL (ref 6.0–8.5)

## 2021-03-05 LAB — LIPID PANEL
Cholesterol: 226 mg/dL — ABNORMAL HIGH (ref 0–200)
HDL: 39 mg/dL — ABNORMAL LOW
LDL Cholesterol: 155 mg/dL — ABNORMAL HIGH (ref 0–99)
Total CHOL/HDL Ratio: 5.8 ratio
Triglycerides: 161 mg/dL — ABNORMAL HIGH
VLDL: 32 mg/dL (ref 0–40)

## 2021-03-05 LAB — PROTEIN C, TOTAL: Protein C, Total: 86 % (ref 60–150)

## 2021-03-05 MED ORDER — ATORVASTATIN CALCIUM 40 MG PO TABS
40.0000 mg | ORAL_TABLET | Freq: Every day | ORAL | Status: DC
  Administered 2021-03-05: 40 mg via ORAL
  Filled 2021-03-05: qty 1

## 2021-03-05 MED ORDER — METOPROLOL SUCCINATE ER 25 MG PO TB24: 25.0000 mg | ORAL_TABLET | Freq: Two times a day (BID) | ORAL | 1 refills | Status: DC

## 2021-03-05 MED ORDER — HYDROCODONE-ACETAMINOPHEN 5-325 MG PO TABS: 1.0000 | ORAL_TABLET | Freq: Four times a day (QID) | ORAL | 0 refills | Status: AC | PRN

## 2021-03-05 MED ORDER — LOSARTAN POTASSIUM 25 MG PO TABS: 12.5000 mg | ORAL_TABLET | Freq: Every day | ORAL | 1 refills | Status: DC

## 2021-03-05 MED ORDER — ATORVASTATIN CALCIUM 40 MG PO TABS: 40.0000 mg | ORAL_TABLET | Freq: Every evening | ORAL | 1 refills | Status: DC

## 2021-03-05 MED ORDER — APIXABAN 5 MG PO TABS: 5.0000 mg | ORAL_TABLET | Freq: Two times a day (BID) | ORAL | 1 refills | Status: DC

## 2021-03-05 NOTE — Discharge Instructions (Signed)
IMPORTANT INFORMATION: PAY CLOSE ATTENTION  ° °PHYSICIAN DISCHARGE INSTRUCTIONS ° °Follow with Primary care provider  Sun, Yun, MD  and other consultants as instructed by your Hospitalist Physician ° °SEEK MEDICAL CARE OR RETURN TO EMERGENCY ROOM IF SYMPTOMS COME BACK, WORSEN OR NEW PROBLEM DEVELOPS  ° °Please note: °You were cared for by a hospitalist during your hospital stay. Every effort will be made to forward records to your primary care provider.  You can request that your primary care provider send for your hospital records if they have not received them.  Once you are discharged, your primary care physician will handle any further medical issues. Please note that NO REFILLS for any discharge medications will be authorized once you are discharged, as it is imperative that you return to your primary care physician (or establish a relationship with a primary care physician if you do not have one) for your post hospital discharge needs so that they can reassess your need for medications and monitor your lab values. ° °Please get a complete blood count and chemistry panel checked by your Primary MD at your next visit, and again as instructed by your Primary MD. ° °Get Medicines reviewed and adjusted: °Please take all your medications with you for your next visit with your Primary MD ° °Laboratory/radiological data: °Please request your Primary MD to go over all hospital tests and procedure/radiological results at the follow up, please ask your primary care provider to get all Hospital records sent to his/her office. ° °In some cases, they will be blood work, cultures and biopsy results pending at the time of your discharge. Please request that your primary care provider follow up on these results. ° °If you are diabetic, please bring your blood sugar readings with you to your follow up appointment with primary care.   ° °Please call and make your follow up appointments as soon as possible.   ° °Also Note the  following: °If you experience worsening of your admission symptoms, develop shortness of breath, life threatening emergency, suicidal or homicidal thoughts you must seek medical attention immediately by calling 911 or calling your MD immediately  if symptoms less severe. ° °You must read complete instructions/literature along with all the possible adverse reactions/side effects for all the Medicines you take and that have been prescribed to you. Take any new Medicines after you have completely understood and accpet all the possible adverse reactions/side effects.  ° °Do not drive when taking Pain medications or sleeping medications (Benzodiazepines) ° °Do not take more than prescribed Pain, Sleep and Anxiety Medications. It is not advisable to combine anxiety,sleep and pain medications without talking with your primary care practitioner ° °Special Instructions: If you have smoked or chewed Tobacco  in the last 2 yrs please stop smoking, stop any regular Alcohol  and or any Recreational drug use. ° °Wear Seat belts while driving.  Do not drive if taking any narcotic, mind altering or controlled substances or recreational drugs or alcohol.  ° ° ° ° ° °

## 2021-03-05 NOTE — Progress Notes (Signed)
Progress Note  Patient Name: Nicolas Curtis Date of Encounter: 03/05/2021  [facility] Cardiologist: New, Dr Wyline Mood  Subjective   No complaints  Inpatient Medications    Scheduled Meds:  apixaban  5 mg Oral BID   losartan  12.5 mg Oral Daily   metoprolol succinate  25 mg Oral BID   pantoprazole  40 mg Oral Daily   polyethylene glycol  17 g Oral Daily   senna  2 tablet Oral Daily   Continuous Infusions:  PRN Meds: HYDROcodone-acetaminophen, zolpidem   Vital Signs    Vitals:   03/04/21 1410 03/04/21 2043 03/05/21 0459 03/05/21 0814  BP: 111/80 (!) 111/93 107/85 102/73  Pulse: 80 90 65 66  Resp: Temp: 98.6 F (37 C) 98.2 F (36.8 C) 97.9 F (36.6 C)   TempSrc: Oral Oral Oral   SpO2: 97% 98% 98%   Weight:      Height:       No intake or output data in the 24 hours ending 03/05/21 0832 Last 3 Weights 03/03/2021 03/02/2021 06/09/2013  Weight (lbs) 219 lb 9.3 oz 236 lb 226 lb  Weight (kg) 99.6 kg 107.049 kg 102.513 kg      Telemetry    Rate controlled afib, converted to SR early this AM - Personally Reviewed  ECG    N/a - Personally Reviewed  Physical Exam   GEN: No acute distress.   Neck: No JVD Cardiac: RRR, no murmurs, rubs, or gallops.  Respiratory: Clear to auscultation bilaterally. GI: Soft, nontender, non-distended  MS: No edema; No deformity. Neuro:  Nonfocal  Psych: Normal affect   Labs    High Sensitivity Troponin:  No results for input(s): TROPONINIHS in the last 720 hours.   Chemistry Recent Labs  Lab 03/02/21 1347 03/03/21 0422 03/04/21 0507 03/05/21 0539  NA 137 137 136 139  K 4.1 4.0 4.1 4.6  CL 103 101 103 102  CO2 GLUCOSE 103* 105* 102* 93  BUN CREATININE 1.29* 1.44* 1.24 1.53*  CALCIUM 8.9 8.6* 8.9 9.3  MG  --   --  2.1  --   PROT 6.8  --  7.1  --   ALBUMIN 4.1  --  4.0  --   AST 22  --  17  --   ALT 40  --  32  --   ALKPHOS 48  --  50  --   BILITOT 1.0  --  1.2  --   GFRNONAA >60  56* >60 52*  ANIONGAP Lipids  Recent Labs  Lab 03/05/21 0539  CHOL 226*  TRIG 161*  HDL 39*  LDLCALC 155*  CHOLHDL 5.8    Hematology Recent Labs  Lab 03/02/21 1347 03/04/21 0507  WBC 6.9 12.8*  RBC 5.35 5.79  HGB 17.3* 17.8*  HCT 48.8 52.6*  MCV 91.2 90.8  MCH 32.3 30.7  MCHC 35.5 33.8  RDW 12.6 12.5  PLT 210 225   Thyroid  Recent Labs  Lab 03/04/21 0507  TSH 3.193    BNPNo results for input(s): BNP, PROBNP in the last 168 hours.  DDimer No results for input(s): DDIMER in the last 168 hours.   Radiology    US Venous Img Lower Bilateral (DVT)  Result Date: 03/03/2021 CLINICAL DATA:  Right renal infarct. EXAM: BILATERAL LOWER EXTREMITY VENOUS DOPPLER ULTRASOUND TECHNIQUE: Gray-scale sonography with graded compression, as well as  color Doppler and duplex ultrasound were performed to evaluate the lower extremity deep venous systems from the level of the common femoral vein and including the common femoral, femoral, profunda femoral, popliteal and calf veins including the posterior tibial, peroneal and gastrocnemius veins when visible. The superficial great saphenous vein was also interrogated. Spectral Doppler was utilized to evaluate flow at rest and with distal augmentation maneuvers in the common femoral, femoral and popliteal veins. COMPARISON:  None. FINDINGS: RIGHT LOWER EXTREMITY Common Femoral Vein: No evidence of thrombus. Normal compressibility, respiratory phasicity and response to augmentation. Saphenofemoral Junction: No evidence of thrombus. Normal compressibility and flow on color Doppler imaging. Profunda Femoral Vein: No evidence of thrombus. Normal compressibility and flow on color Doppler imaging. Femoral Vein: No evidence of thrombus. Normal compressibility, respiratory phasicity and response to augmentation. Popliteal Vein: No evidence of thrombus. Normal compressibility, respiratory phasicity and response to augmentation. Calf Veins: No evidence  of thrombus. Normal compressibility and flow on color Doppler imaging. LEFT LOWER EXTREMITY Common Femoral Vein: No evidence of thrombus. Normal compressibility, respiratory phasicity and response to augmentation. Saphenofemoral Junction: No evidence of thrombus. Normal compressibility and flow on color Doppler imaging. Profunda Femoral Vein: No evidence of thrombus. Normal compressibility and flow on color Doppler imaging. Femoral Vein: No evidence of thrombus. Normal compressibility, respiratory phasicity and response to augmentation. Popliteal Vein: No evidence of thrombus. Normal compressibility, respiratory phasicity and response to augmentation. Calf Veins: No evidence of thrombus. Normal compressibility and flow on color Doppler imaging. IMPRESSION: No evidence of deep venous thrombosis in either lower extremity. Electronically Signed   By: Richarda Overlie M.D.   On: 03/03/2021 11:03   ECHOCARDIOGRAM COMPLETE  Result Date: 03/03/2021    ECHOCARDIOGRAM REPORT   Patient Name:   Nicolas Curtis Date of Exam: 03/03/2021 Medical Rec #:  [id number]   Height:       71.0 in Accession #:    [id number]  Weight:       219.6 lb Date of Birth:  1960/09/13   BSA:          2.194 m Patient Age:    60 years    BP:           129/77 mmHg Patient Gender: M           HR:           66 bpm. [facility] Procedure: 2D Echo, Cardiac Doppler and Color Doppler Indications:    Evaluation for source of emboli  History:        Patient has no prior history of Echocardiogram examinations.                 CKD.  Sonographer:    Celesta Gentile RCS Referring Phys: [phone] DAVID TAT IMPRESSIONS  1. Left ventricular ejection fraction, by estimation, is 45 to 50%. The left ventricle has low normal to mildly decreased function. The left ventricle demonstrates global hypokinesis. Left ventricular diastolic parameters were normal.  2. Right ventricular systolic function is normal. The right ventricular size is normal. There is normal pulmonary  artery systolic pressure.  3. The mitral valve is normal in structure. Mild mitral valve regurgitation. No evidence of mitral stenosis.  4. The tricuspid valve is abnormal.  5. The aortic valve is tricuspid. Aortic valve regurgitation is not visualized. No aortic stenosis is present.  6. The inferior vena cava is normal in size with greater than 50% respiratory variability, suggesting right atrial pressure of 3 mmHg.  FINDINGS  Left Ventricle: Left ventricular ejection fraction, by estimation, is 45 to 50%. The left ventricle has low normal to mildly decreased function. The left ventricle demonstrates global hypokinesis. Definity contrast agent was given IV to delineate the left ventricular endocardial borders. The left ventricular internal cavity size was normal in size. There is no left ventricular hypertrophy. Left ventricular diastolic parameters were normal. Right Ventricle: The right ventricular size is normal. No increase in right ventricular wall thickness. Right ventricular systolic function is normal. There is normal pulmonary artery systolic pressure. The tricuspid regurgitant velocity is 2.66 m/s, and  with an assumed right atrial pressure of 3 mmHg, the estimated right ventricular systolic pressure is 31.3 mmHg. Left Atrium: Left atrial size was normal in size. Right Atrium: Right atrial size was normal in size. Pericardium: There is no evidence of pericardial effusion. Mitral Valve: The mitral valve is normal in structure. Mild mitral valve regurgitation. No evidence of mitral valve stenosis. Tricuspid Valve: The tricuspid valve is abnormal. Tricuspid valve regurgitation is mild . No evidence of tricuspid stenosis. Aortic Valve: The aortic valve is tricuspid. Aortic valve regurgitation is not visualized. No aortic stenosis is present. Aortic valve mean gradient measures 3.9 mmHg. Aortic valve peak gradient measures 6.9 mmHg. Aortic valve area, by VTI measures 1.78 cm. Pulmonic Valve: The pulmonic valve  was not well visualized. Pulmonic valve regurgitation is not visualized. No evidence of pulmonic stenosis. Aorta: The aortic root is normal in size and structure. Venous: The inferior vena cava is normal in size with greater than 50% respiratory variability, suggesting right atrial pressure of 3 mmHg. IAS/Shunts: No atrial level shunt detected by color flow Doppler.  LEFT VENTRICLE PLAX 2D LVIDd:         5.00 cm   Diastology LVIDs:         3.90 cm   LV e' medial:    7.72 cm/s LV PW:         0.90 cm   LV E/e' medial:  12.2 LV IVS:        0.80 cm   LV e' lateral:   10.70 cm/s LVOT diam:     1.90 cm   LV E/e' lateral: 8.8 LV SV:         50 LV SV Index:   23 LVOT Area:     2.84 cm  RIGHT VENTRICLE RV S prime:     15.70 cm/s TAPSE (M-mode): 2.3 cm LEFT ATRIUM             Index        RIGHT ATRIUM           Index LA diam:        4.30 cm 1.96 cm/m   RA Area:     16.40 cm LA Vol (A2C):   76.0 ml 34.64 ml/m  RA Volume:   40.40 ml  18.41 ml/m LA Vol (A4C):   56.7 ml 25.84 ml/m LA Biplane Vol: 68.5 ml 31.22 ml/m  AORTIC VALVE AV Area (Vmax):    1.78 cm AV Area (Vmean):   1.83 cm AV Area (VTI):     1.78 cm AV Vmax:           130.92 cm/s AV Vmean:          92.017 cm/s AV VTI:            0.282 m AV Peak Grad:      6.9 mmHg AV Mean Grad:  3.9 mmHg LVOT Vmax:         82.40 cm/s LVOT Vmean:        59.500 cm/s LVOT VTI:          0.177 m LVOT/AV VTI ratio: 0.63  AORTA Ao Root diam: 3.40 cm MITRAL VALVE               TRICUSPID VALVE MV Area (PHT): 3.91 cm    TR Peak grad:   28.3 mmHg MV Decel Time: 194 msec    TR Vmax:        266.00 cm/s MR Peak grad: 113.2 mmHg MR Mean grad: 77.0 mmHg    SHUNTS MR Vmax:      532.00 cm/s  Systemic VTI:  0.18 m MR Vmean:     413.0 cm/s   Systemic Diam: 1.90 cm MV E velocity: 94.40 cm/s MV A velocity: 88.30 cm/s MV E/A ratio:  1.07 Dina Rich MD Electronically signed by Dina Rich MD Signature Date/Time: 03/03/2021/1:21:46 PM    Final     Cardiac Studies    Patient Profile      Nicolas Curtis is a 61 y.o. male with a hx of PUD who is being seen 03/04/2021 for the evaluation of new diagnosis of afib and mild LV dysfunction at the request of Dr Tat.  Assessment & Plan    1.Afib - new diagnosis this admission - patient presented with renal infarct, certaintly may have been related to afib and embolic event - he has been started on eliquis 5mg  bid, continue. CHADS2Vasc score is (HTN-based on vitals during admit, embolic event, HF with mild LV dysfunction) is 4 -started toprol 25mg  bid - has converted back to SR this AM in the 60s, continue current meds   2. Low normal to mildly decreased LV systolic function - may be related to new diagnosis of afib and elevated rates -started toprol 25mg  bid, losartan 12.5mg  daily yesterday - somewhat labile renal function since admit, Cr 1.2 to 1.5 today. Will recheck bmet FIrday as outpatient, if ongoing uptrend would d/c ARB - soft bp's, would not tirate HF meds further.    3.Aortic atherosclerosis - noted on CT imaging -elevetated cholesterol, start lipitor 40mg  daily.    Ok for discharge from cardiac standpoint. We will arrange a bmet for Friday, if further uptrend in Cr would need to d/c ARB.    For questions or updates, please contact [facility] Please consult [email or web address] for contact info under        Signed, Dina Rich, MD  03/05/2021, 8:32 AM

## 2021-03-05 NOTE — Discharge Summary (Signed)
Physician Discharge Summary  Nicolas Curtis [id number] DOB: Feb 27, 1961 DOA: 03/02/2021  PCP: Sandi Mariscal, MD  Admit date: 03/02/2021 Discharge date: 03/05/2021  Admitted From:  HOME  Disposition: HOME   Recommendations for Outpatient Follow-up:  Follow up with cardiology in 1-2 weeks Please obtain BMP/CBC in 1-2 week Follow up with PCP in 2 weeks   Discharge Condition: STABLE   CODE STATUS: FULL DIET:  heart healthy recommended    Brief Hospitalization Summary: Please see all hospital notes, images, labs for full details of the hospitalization. Brief History:  61 year old male with a history of hypertension not on medication and peptic ulcer disease presenting with acute onset of right-sided abdominal pain, primarily right lower quadrant, that began in the early morning 03/02/2021 while the patient was having sexual intercourse.  The patient described the pain as severe and sharp.  He went to lay down after which he began having numerous episodes of nausea and vomiting without blood.  He states that he went to take some ibuprofen and lay down, but later had more emesis.  Prior to this episode, the patient had been in his usual state of health without any complaints.  Patient denies fevers, chills, headache, chest pain, dyspnea, nausea, vomiting, diarrhea, abdominal pain, dysuria, hematuria, hematochezia, and melena.  The patient is a former smoker quitting 17 years ago.  Used to drink daily, but quit drinking 2 weeks ago.  He is a Administrator by trade.  He denies any illicit drugs. CT abdomen and pelvis showed mild gallbladder distention without inflammation.  There is hepatic steatosis.  There is a focal low-density in the right renal parenchyma with a wedge-shaped configuration concerning for vascular phenomena.  CTA abdomen showed mild narrowing of the celiac artery without significant hemodynamic stenosis.  There is no other acute vascular findings.  Patient was admitted for further work-up and  treatment of his renal infarct.  On the evening, 03/03/21, he developed afib with RVR and he was started on IV lopressor which has been transitioned to po metoprolol.  Apixaban started.   Assessment/Plan: Abdominal pain/right renal infarct -03/03/21 Echo--EF 45-50%, global HK, mild MR/TR -anti phospholipid antibody -Prothrombin gene mutation -Factor V Leiden -Urine drug screen--neg -Blood cultures x2 sets -SPEP -Judicious opioids   New diagnosis Atrial Fibrillation, type unspecified- -likely the etiology of his renal infarct -03/03/21 Echo--EF 45-50%, global HK, mild MR/TR -TSH -CHADS-VASc = 3 (CHF, HTN, ASVD) -changed IV lopressor to po metoprolol 25 mg BID -start apixaban   Cardiomyopathy -?rate related vs ischemic -cardiology consulted and started losartan 12.5 mg daily   Intractable nausea and vomiting - RESOLVED  -Seems to be improving -Continue IV fluids -Check lipase--48 -Gradually advance diet>>tolerating -Continue PPI   Essential hypertension -Patient not currently on any medications -started on metoprolol   GERD -Continue Protonix   CKD stage II -Baseline creatinine 1.2-1.4   Discharge Diagnoses:  Principal Problem:   Renal infarct [facility]) Active Problems:   Right flank pain   Renal insufficiency, mild   CKD (chronic kidney disease) stage 2, GFR 60-89 ml/min   Intractable vomiting   Atrial fibrillation with RVR ([place])   Discharge Instructions:  Allergies as of 03/05/2021       Reactions   Codeine Rash   Oxycodone Rash        Medication List     STOP taking these medications    ciprofloxacin 500 MG tablet Commonly known as: CIPRO   HYDROmorphone 4 MG tablet Commonly known as: Dilaudid   ibuprofen  800 MG tablet Commonly known as: ADVIL   meloxicam 15 MG tablet Commonly known as: MOBIC   metroNIDAZOLE 500 MG tablet Commonly known as: FLAGYL   ondansetron 8 MG disintegrating tablet Commonly known as: Zofran ODT       TAKE these  medications    apixaban 5 MG Tabs tablet Commonly known as: ELIQUIS Take 1 tablet (5 mg total) by mouth 2 (two) times daily.   atorvastatin 40 MG tablet Commonly known as: LIPITOR Take 1 tablet (40 mg total) by mouth every evening.   HYDROcodone-acetaminophen 5-325 MG tablet Commonly known as: NORCO/VICODIN Take 1 tablet by mouth every 6 (six) hours as needed for up to 5 days for moderate pain. What changed:  how much to take when to take this reasons to take this   losartan 25 MG tablet Commonly known as: COZAAR Take 0.5 tablets (12.5 mg total) by mouth daily. Start taking on: March 06, 2021   metoprolol succinate 25 MG 24 hr tablet Commonly known as: TOPROL-XL Take 1 tablet (25 mg total) by mouth 2 (two) times daily.   PRILOSEC PO Take by mouth.        Follow-up Information     Erma Heritage, PA-C Follow up.   Specialties: Physician Assistant, Cardiology Why: Have blood work on Friday (03/07/2020). Can come to the main enterance of [place] between 8:30 - 3:30. Do not need to be fasting. Cardiology follow-up appointment will be on 03/26/2021 at 2:30 PM. Contact information: [address] [place] [address] [phone]         Sandi Mariscal, MD. Schedule an appointment as soon as possible for a visit in 2 week(s).   Specialty: Internal Medicine Why: Hospital Follow Up Contact information: [place] [address] [phone]                Allergies  Allergen Reactions   Codeine Rash   Oxycodone Rash   Allergies as of 03/05/2021       Reactions   Codeine Rash   Oxycodone Rash        Medication List     STOP taking these medications    ciprofloxacin 500 MG tablet Commonly known as: CIPRO   HYDROmorphone 4 MG tablet Commonly known as: Dilaudid   ibuprofen 800 MG tablet Commonly known as: ADVIL   meloxicam 15 MG tablet Commonly known as: MOBIC   metroNIDAZOLE 500 MG tablet Commonly known as: FLAGYL    ondansetron 8 MG disintegrating tablet Commonly known as: Zofran ODT       TAKE these medications    apixaban 5 MG Tabs tablet Commonly known as: ELIQUIS Take 1 tablet (5 mg total) by mouth 2 (two) times daily.   atorvastatin 40 MG tablet Commonly known as: LIPITOR Take 1 tablet (40 mg total) by mouth every evening.   HYDROcodone-acetaminophen 5-325 MG tablet Commonly known as: NORCO/VICODIN Take 1 tablet by mouth every 6 (six) hours as needed for up to 5 days for moderate pain. What changed:  how much to take when to take this reasons to take this   losartan 25 MG tablet Commonly known as: COZAAR Take 0.5 tablets (12.5 mg total) by mouth daily. Start taking on: March 06, 2021   metoprolol succinate 25 MG 24 hr tablet Commonly known as: TOPROL-XL Take 1 tablet (25 mg total) by mouth 2 (two) times daily.   PRILOSEC PO Take by mouth.        Procedures/Studies: CT ANGIO  ABDOMEN W &/OR WO CONTRAST  Result Date: 03/02/2021 CLINICAL DATA:  Renal ischemia or infarction.  Abnormal CT scan. EXAM: CT ANGIOGRAPHY ABDOMEN TECHNIQUE: Multidetector CT imaging of the abdomen was performed using the standard protocol during bolus administration of intravenous contrast. Multiplanar reconstructed images and MIPs were obtained and reviewed to evaluate the vascular anatomy. CONTRAST:  18mL OMNIPAQUE IOHEXOL 350 MG/ML SOLN COMPARISON:  CT of the abdomen and pelvis 03/02/21 at 2:55. FINDINGS: VASCULAR Aorta: Atherosclerotic irregularities are present without aneurysm or stenosis. Celiac: Mild narrowing present at the origin without significant stenosis relative to the more distal vessel. Branch vessels are within normal limits. SMA: Patent without evidence of aneurysm, dissection, vasculitis or significant stenosis. Renals: Both renal arteries are patent without evidence of aneurysm, dissection, vasculitis, fibromuscular dysplasia or significant stenosis. IMA: Patent without evidence of  aneurysm, dissection, vasculitis or significant stenosis. Inflow: Patent without evidence of aneurysm, dissection, vasculitis or significant stenosis. Veins: No obvious venous abnormality within the limitations of this arterial phase study. Review of the MIP images confirms the above findings. NON-VASCULAR Lower chest: The lung bases are clear.  Heart size is normal. Hepatobiliary: Calcified gallstones again noted without inflammatory change. Diffuse fatty infiltration of the liver noted. No focal hepatic lesions present. Pancreas: Unremarkable. No pancreatic ductal dilatation or surrounding inflammatory changes. Spleen: Normal in size without focal abnormality. Adrenals/Urinary Tract: The area of hypoattenuation in the posterior right kidney is unchanged. No other mass lesion or stone is present. No obstruction present. Central sinus cyst again noted on the left. Ureters are within normal limits. Stomach/Bowel: Colonic diverticula are present. Visualized bowel is otherwise within normal limits. Lymphatic: No significant adenopathy is present. Other: No abdominal wall hernia or abnormality. No abdominopelvic ascites. Musculoskeletal: No acute or significant osseous findings. IMPRESSION: 1. 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 2. Atherosclerotic changes of the abdominal aorta without aneurysm or stenosis. Aortic Atherosclerosis (ICD10-I70.0). 3. Stable appearance of low-density in the posterior right kidney. This likely represents a focal infarct as previously described. 4. Hepatic steatosis. 5. Cholelithiasis without cholecystitis. 6. Colonic diverticulosis without diverticulitis. Electronically Signed   By: San Morelle M.D.   On: 03/02/2021 18:19   CT ABDOMEN PELVIS W CONTRAST  Result Date: 03/02/2021 CLINICAL DATA:  Right lower quadrant abdominal pain. EXAM: CT ABDOMEN AND PELVIS WITH CONTRAST TECHNIQUE: Multidetector CT  imaging of the abdomen and pelvis was performed using the standard protocol following bolus administration of intravenous contrast. CONTRAST:  82mL OMNIPAQUE IOHEXOL 350 MG/ML SOLN COMPARISON:  01/31/2013 FINDINGS: Lower chest: Lung bases are clear. Hepatobiliary: Calcified gallstones. Mild gallbladder distension without surrounding inflammatory changes. Decreased attenuation in the liver is suggestive for at least mild steatosis. No focal liver lesion. No biliary dilatation. Pancreas: Unremarkable. No pancreatic ductal dilatation or surrounding inflammatory changes. Spleen: Normal in size without focal abnormality. Adrenals/Urinary Tract: Normal adrenal glands. Left renal sinus cyst measuring up to 1.7 cm. Negative for left hydronephrosis. Focal low-density in the right kidney interpolar parenchyma. The low-density has a slightly wedge-shaped configuration and portions of this low-density have well-defined borders suggesting a vascular distribution. Minimal right perinephric stranding. Normal appearance of the urinary bladder. No ureter dilatation. Stomach/Bowel: Colonic diverticula. No evidence for acute bowel inflammation. Normal appendix. Normal stomach. Vascular/Lymphatic: Mild atherosclerotic disease in the abdominal aorta and iliac arteries. Negative for an aortic aneurysm or dissection. Main visceral arteries are patent. Evidence for small bilateral accessory renal arteries. Mild fat stranding in  the central abdominal mesentery appears similar to the exam from 2014. Slightly prominent lymph nodes in the central abdominal mesentery are also similar to prior examination. No significant lymph node enlargement in the abdomen or pelvis. Reproductive: Prostate is unremarkable. Other: Negative for ascites. Negative for free air. Small umbilical hernia containing fat. Musculoskeletal: No acute bone abnormality. IMPRESSION: 1. Focal low-density in the right kidney interpolar region. Differential diagnosis includes  renal infarct versus focal pyelonephritis. No significant right perinephric stranding. Based on the morphology of the low-density area, favor a renal infarct. 2. Cholelithiasis without evidence for gallbladder inflammation. 3. Slightly decreased attenuation of the liver which could represent steatosis. 4.  Aortic Atherosclerosis (ICD10-I70.0). These results were called by telephone at the time of interpretation on 03/02/2021 at 3:21 pm to provider [facility] , who verbally acknowledged these results. Electronically Signed   By: Markus Daft M.D.   On: 03/02/2021 15:26   US Venous Img Lower Bilateral (DVT)  Result Date: 03/03/2021 CLINICAL DATA:  Right renal infarct. EXAM: BILATERAL LOWER EXTREMITY VENOUS DOPPLER ULTRASOUND TECHNIQUE: Gray-scale sonography with graded compression, as well as color Doppler and duplex ultrasound were performed to evaluate the lower extremity deep venous systems from the level of the common femoral vein and including the common femoral, femoral, profunda femoral, popliteal and calf veins including the posterior tibial, peroneal and gastrocnemius veins when visible. The superficial great saphenous vein was also interrogated. Spectral Doppler was utilized to evaluate flow at rest and with distal augmentation maneuvers in the common femoral, femoral and popliteal veins. COMPARISON:  None. FINDINGS: RIGHT LOWER EXTREMITY Common Femoral Vein: No evidence of thrombus. Normal compressibility, respiratory phasicity and response to augmentation. Saphenofemoral Junction: No evidence of thrombus. Normal compressibility and flow on color Doppler imaging. Profunda Femoral Vein: No evidence of thrombus. Normal compressibility and flow on color Doppler imaging. Femoral Vein: No evidence of thrombus. Normal compressibility, respiratory phasicity and response to augmentation. Popliteal Vein: No evidence of thrombus. Normal compressibility, respiratory phasicity and response to augmentation. Calf Veins:  No evidence of thrombus. Normal compressibility and flow on color Doppler imaging. LEFT LOWER EXTREMITY Common Femoral Vein: No evidence of thrombus. Normal compressibility, respiratory phasicity and response to augmentation. Saphenofemoral Junction: No evidence of thrombus. Normal compressibility and flow on color Doppler imaging. Profunda Femoral Vein: No evidence of thrombus. Normal compressibility and flow on color Doppler imaging. Femoral Vein: No evidence of thrombus. Normal compressibility, respiratory phasicity and response to augmentation. Popliteal Vein: No evidence of thrombus. Normal compressibility, respiratory phasicity and response to augmentation. Calf Veins: No evidence of thrombus. Normal compressibility and flow on color Doppler imaging. IMPRESSION: No evidence of deep venous thrombosis in either lower extremity. Electronically Signed   By: Markus Daft M.D.   On: 03/03/2021 11:03   ECHOCARDIOGRAM COMPLETE  Result Date: 03/03/2021    ECHOCARDIOGRAM REPORT   Patient Name:   GAILARD ROMULUS Date of Exam: 03/03/2021 Medical Rec #:  [id number]   Height:       71.0 in Accession #:    [id number]  Weight:       219.6 lb Date of Birth:  [birth year]   BSA:          2.194 m Patient Age:    63 years    BP:           129/77 mmHg Patient Gender: M           HR:           66 bpm. Exam  Location:  [facility] Procedure: 2D Echo, Cardiac Doppler and Color Doppler Indications:    Evaluation for source of emboli  History:        Patient has no prior history of Echocardiogram examinations.                 CKD.  Sonographer:    Alvino Chapel RCS Referring Phys: [phone] DAVID TAT IMPRESSIONS  1. Left ventricular ejection fraction, by estimation, is 45 to 50%. The left ventricle has low normal to mildly decreased function. The left ventricle demonstrates global hypokinesis. Left ventricular diastolic parameters were normal.  2. Right ventricular systolic function is normal. The right ventricular size is normal. There is normal  pulmonary artery systolic pressure.  3. The mitral valve is normal in structure. Mild mitral valve regurgitation. No evidence of mitral stenosis.  4. The tricuspid valve is abnormal.  5. The aortic valve is tricuspid. Aortic valve regurgitation is not visualized. No aortic stenosis is present.  6. The inferior vena cava is normal in size with greater than 50% respiratory variability, suggesting right atrial pressure of 3 mmHg. FINDINGS  Left Ventricle: Left ventricular ejection fraction, by estimation, is 45 to 50%. The left ventricle has low normal to mildly decreased function. The left ventricle demonstrates global hypokinesis. Definity contrast agent was given IV to delineate the left ventricular endocardial borders. The left ventricular internal cavity size was normal in size. There is no left ventricular hypertrophy. Left ventricular diastolic parameters were normal. Right Ventricle: The right ventricular size is normal. No increase in right ventricular wall thickness. Right ventricular systolic function is normal. There is normal pulmonary artery systolic pressure. The tricuspid regurgitant velocity is 2.66 m/s, and  with an assumed right atrial pressure of 3 mmHg, the estimated right ventricular systolic pressure is 123456 mmHg. Left Atrium: Left atrial size was normal in size. Right Atrium: Right atrial size was normal in size. Pericardium: There is no evidence of pericardial effusion. Mitral Valve: The mitral valve is normal in structure. Mild mitral valve regurgitation. No evidence of mitral valve stenosis. Tricuspid Valve: The tricuspid valve is abnormal. Tricuspid valve regurgitation is mild . No evidence of tricuspid stenosis. Aortic Valve: The aortic valve is tricuspid. Aortic valve regurgitation is not visualized. No aortic stenosis is present. Aortic valve mean gradient measures 3.9 mmHg. Aortic valve peak gradient measures 6.9 mmHg. Aortic valve area, by VTI measures 1.78 cm. Pulmonic Valve: The  pulmonic valve was not well visualized. Pulmonic valve regurgitation is not visualized. No evidence of pulmonic stenosis. Aorta: The aortic root is normal in size and structure. Venous: The inferior vena cava is normal in size with greater than 50% respiratory variability, suggesting right atrial pressure of 3 mmHg. IAS/Shunts: No atrial level shunt detected by color flow Doppler.  LEFT VENTRICLE PLAX 2D LVIDd:         5.00 cm   Diastology LVIDs:         3.90 cm   LV e' medial:    7.72 cm/s LV PW:         0.90 cm   LV E/e' medial:  12.2 LV IVS:        0.80 cm   LV e' lateral:   10.70 cm/s LVOT diam:     1.90 cm   LV E/e' lateral: 8.8 LV SV:         50 LV SV Index:   23 LVOT Area:     2.84 cm  RIGHT VENTRICLE RV S  prime:     15.70 cm/s TAPSE (M-mode): 2.3 cm LEFT ATRIUM             Index        RIGHT ATRIUM           Index LA diam:        4.30 cm 1.96 cm/m   RA Area:     16.40 cm LA Vol (A2C):   76.0 ml 34.64 ml/m  RA Volume:   40.40 ml  18.41 ml/m LA Vol (A4C):   56.7 ml 25.84 ml/m LA Biplane Vol: 68.5 ml 31.22 ml/m  AORTIC VALVE AV Area (Vmax):    1.78 cm AV Area (Vmean):   1.83 cm AV Area (VTI):     1.78 cm AV Vmax:           130.92 cm/s AV Vmean:          92.017 cm/s AV VTI:            0.282 m AV Peak Grad:      6.9 mmHg AV Mean Grad:      3.9 mmHg LVOT Vmax:         82.40 cm/s LVOT Vmean:        59.500 cm/s LVOT VTI:          0.177 m LVOT/AV VTI ratio: 0.63  AORTA Ao Root diam: 3.40 cm MITRAL VALVE               TRICUSPID VALVE MV Area (PHT): 3.91 cm    TR Peak grad:   28.3 mmHg MV Decel Time: 194 msec    TR Vmax:        266.00 cm/s MR Peak grad: 113.2 mmHg MR Mean grad: 77.0 mmHg    SHUNTS MR Vmax:      532.00 cm/s  Systemic VTI:  0.18 m MR Vmean:     413.0 cm/s   Systemic Diam: 1.90 cm MV E velocity: 94.40 cm/s MV A velocity: 88.30 cm/s MV E/A ratio:  1.07 Carlyle Dolly MD Electronically signed by Carlyle Dolly MD Signature Date/Time: 03/03/2021/1:21:46 PM    Final      Subjective: Pt reports  no specific complaints and feeling better.  Discharge Exam: Vitals:   03/05/21 0459 03/05/21 0814  BP: 107/85 102/73  Pulse: 65 66  Resp: 16   Temp: 97.9 F (36.6 C)   SpO2: 98%    Vitals:   03/04/21 1410 03/04/21 2043 03/05/21 0459 03/05/21 0814  BP: 111/80 (!) 111/93 107/85 102/73  Pulse: 80 90 65 66  Resp: 20 16 16    Temp: 98.6 F (37 C) 98.2 F (36.8 C) 97.9 F (36.6 C)   TempSrc: Oral Oral Oral   SpO2: 97% 98% 98%   Weight:      Height:       General: Pt is alert, awake, not in acute distress Cardiovascular: normal S1/S2 +, no rubs, no gallops Respiratory: CTA bilaterally, no wheezing, no rhonchi Abdominal: Soft, NT, ND, bowel sounds + Extremities: no edema, no cyanosis   The results of significant diagnostics from this hospitalization (including imaging, microbiology, ancillary and laboratory) are listed below for reference.    Microbiology: Recent Results (from the past 240 hour(s))  Resp Panel by RT-PCR (Flu A&B, Covid) Nasopharyngeal Swab     Status: None   Collection Time: 03/02/21  3:36 PM   Specimen: Nasopharyngeal Swab; Nasopharyngeal(NP) swabs in vial transport medium  Result Value Ref Range Status   SARS  Coronavirus 2 by RT PCR NEGATIVE NEGATIVE Final    Comment: (NOTE) SARS-CoV-2 target nucleic acids are NOT DETECTED.  The SARS-CoV-2 RNA is generally detectable in upper respiratory specimens during the acute phase of infection. The lowest concentration of SARS-CoV-2 viral copies this assay can detect is 138 copies/mL. A negative result does not preclude SARS-Cov-2 infection and should not be used as the sole basis for treatment or other patient management decisions. A negative result may occur with  improper specimen collection/handling, submission of specimen other than nasopharyngeal swab, presence of viral mutation(s) within the areas targeted by this assay, and inadequate number of viral copies(<138 copies/mL). A negative result must be  combined with clinical observations, patient history, and epidemiological information. The expected result is Negative.  Fact Sheet for Patients:  EntrepreneurPulse.com.au  Fact Sheet for Healthcare Providers:  IncredibleEmployment.be  This test is no t yet approved or cleared by the [place] FDA and  has been authorized for detection and/or diagnosis of SARS-CoV-2 by FDA under an Emergency Use Authorization (EUA). This EUA will remain  in effect (meaning this test can be used) for the duration of the COVID-19 declaration under Section 564(b)(1) of the Act, 21 U.S.C.section 360bbb-3(b)(1), unless the authorization is terminated  or revoked sooner.       Influenza A by PCR NEGATIVE NEGATIVE Final   Influenza B by PCR NEGATIVE NEGATIVE Final    Comment: (NOTE) The Xpert Xpress SARS-CoV-2/FLU/RSV plus assay is intended as an aid in the diagnosis of influenza from Nasopharyngeal swab specimens and should not be used as a sole basis for treatment. Nasal washings and aspirates are unacceptable for Xpert Xpress SARS-CoV-2/FLU/RSV testing.  Fact Sheet for Patients: EntrepreneurPulse.com.au  Fact Sheet for Healthcare Providers: IncredibleEmployment.be  This test is not yet approved or cleared by the [place] FDA and has been authorized for detection and/or diagnosis of SARS-CoV-2 by FDA under an Emergency Use Authorization (EUA). This EUA will remain in effect (meaning this test can be used) for the duration of the COVID-19 declaration under Section 564(b)(1) of the Act, 21 U.S.C. section 360bbb-3(b)(1), unless the authorization is terminated or revoked.  Performed at [facility], [address], [place], [place] [address]   Culture, blood (routine x 2)     Status: None (Preliminary result)   Collection Time: 03/03/21  8:27 AM   Specimen: Right Antecubital; Blood  Result Value Ref Range Status    Specimen Description RIGHT ANTECUBITAL  Final   Special Requests   Final    BOTTLES DRAWN AEROBIC AND ANAEROBIC Blood Culture adequate volume   Culture   Final    NO GROWTH 2 DAYS Performed at [facility], [address] [place], [place] [address]    Report Status PENDING  Incomplete  Culture, blood (routine x 2)     Status: None (Preliminary result)   Collection Time: 03/03/21  8:30 AM   Specimen: BLOOD RIGHT WRIST  Result Value Ref Range Status   Specimen Description BLOOD RIGHT WRIST  Final   Special Requests   Final    BOTTLES DRAWN AEROBIC AND ANAEROBIC Blood Culture adequate volume   Culture   Final    NO GROWTH 2 DAYS Performed at [facility], [address] [place], [place] [address]    Report Status PENDING  Incomplete     Labs: BNP (last 3 results) No results for input(s): BNP in the last 8760 hours. Basic Metabolic Panel: Recent Labs  Lab 03/02/21 1347 03/03/21 0422 03/04/21 0507  03/05/21 0539  NA 137 137 136 139  K 4.1 4.0 4.1 4.6  CL 103 101 103 102  CO2 26 26 23 28   GLUCOSE 103* 105* 102* 93  BUN 14 13 15 18   CREATININE 1.29* 1.44* 1.24 1.53*  CALCIUM 8.9 8.6* 8.9 9.3  MG  --   --  2.1  --    Liver Function Tests: Recent Labs  Lab 03/02/21 1347 03/04/21 0507  AST 22 17  ALT 40 32  ALKPHOS 48 50  BILITOT 1.0 1.2  PROT 6.8 7.1  ALBUMIN 4.1 4.0   Recent Labs  Lab 03/03/21 0828  LIPASE 48   No results for input(s): AMMONIA in the last 168 hours. CBC: Recent Labs  Lab 03/02/21 1347 03/04/21 0507  WBC 6.9 12.8*  NEUTROABS 4.4  --   HGB 17.3* 17.8*  HCT 48.8 52.6*  MCV 91.2 90.8  PLT 210 225   Cardiac Enzymes: No results for input(s): CKTOTAL, CKMB, CKMBINDEX, TROPONINI in the last 168 hours. BNP: Invalid input(s): POCBNP CBG: No results for input(s): GLUCAP in the last 168 hours. D-Dimer No results for input(s): DDIMER in the last 72 hours. Hgb A1c No results for input(s): HGBA1C in the last 72 hours. Lipid Profile Recent  Labs    03/05/21 0539  CHOL 226*  HDL 39*  LDLCALC 155*  TRIG 161*  CHOLHDL 5.8   Thyroid function studies Recent Labs    03/04/21 0507  TSH 3.193   Anemia work up No results for input(s): VITAMINB12, FOLATE, FERRITIN, TIBC, IRON, RETICCTPCT in the last 72 hours. Urinalysis    Component Value Date/Time   COLORURINE YELLOW 03/02/2021 1300   APPEARANCEUR CLEAR 03/02/2021 1300   LABSPEC 1.006 03/02/2021 1300   PHURINE 7.0 03/02/2021 1300   GLUCOSEU NEGATIVE 03/02/2021 1300   HGBUR NEGATIVE 03/02/2021 1300   BILIRUBINUR NEGATIVE 03/02/2021 1300   KETONESUR NEGATIVE 03/02/2021 1300   PROTEINUR 30 (A) 03/02/2021 1300   UROBILINOGEN 0.2 01/30/2013 2329   NITRITE NEGATIVE 03/02/2021 1300   LEUKOCYTESUR NEGATIVE 03/02/2021 1300   Sepsis Labs Invalid input(s): PROCALCITONIN,  WBC,  LACTICIDVEN Microbiology Recent Results (from the past 240 hour(s))  Resp Panel by RT-PCR (Flu A&B, Covid) Nasopharyngeal Swab     Status: None   Collection Time: 03/02/21  3:36 PM   Specimen: Nasopharyngeal Swab; Nasopharyngeal(NP) swabs in vial transport medium  Result Value Ref Range Status   SARS Coronavirus 2 by RT PCR NEGATIVE NEGATIVE Final    Comment: (NOTE) SARS-CoV-2 target nucleic acids are NOT DETECTED.  The SARS-CoV-2 RNA is generally detectable in upper respiratory specimens during the acute phase of infection. The lowest concentration of SARS-CoV-2 viral copies this assay can detect is 138 copies/mL. A negative result does not preclude SARS-Cov-2 infection and should not be used as the sole basis for treatment or other patient management decisions. A negative result may occur with  improper specimen collection/handling, submission of specimen other than nasopharyngeal swab, presence of viral mutation(s) within the areas targeted by this assay, and inadequate number of viral copies(<138 copies/mL). A negative result must be combined with clinical observations, patient history, and  epidemiological information. The expected result is Negative.  Fact Sheet for Patients:  EntrepreneurPulse.com.au  Fact Sheet for Healthcare Providers:  IncredibleEmployment.be  This test is no t yet approved or cleared by the [place] FDA and  has been authorized for detection and/or diagnosis of SARS-CoV-2 by FDA under an Emergency Use Authorization (EUA). This EUA will remain  in  effect (meaning this test can be used) for the duration of the COVID-19 declaration under Section 564(b)(1) of the Act, 21 U.S.C.section 360bbb-3(b)(1), unless the authorization is terminated  or revoked sooner.       Influenza A by PCR NEGATIVE NEGATIVE Final   Influenza B by PCR NEGATIVE NEGATIVE Final    Comment: (NOTE) The Xpert Xpress SARS-CoV-2/FLU/RSV plus assay is intended as an aid in the diagnosis of influenza from Nasopharyngeal swab specimens and should not be used as a sole basis for treatment. Nasal washings and aspirates are unacceptable for Xpert Xpress SARS-CoV-2/FLU/RSV testing.  Fact Sheet for Patients: EntrepreneurPulse.com.au  Fact Sheet for Healthcare Providers: IncredibleEmployment.be  This test is not yet approved or cleared by the [place] FDA and has been authorized for detection and/or diagnosis of SARS-CoV-2 by FDA under an Emergency Use Authorization (EUA). This EUA will remain in effect (meaning this test can be used) for the duration of the COVID-19 declaration under Section 564(b)(1) of the Act, 21 U.S.C. section 360bbb-3(b)(1), unless the authorization is terminated or revoked.  Performed at [facility], [address], [place], [place] [address]   Culture, blood (routine x 2)     Status: None (Preliminary result)   Collection Time: 03/03/21  8:27 AM   Specimen: Right Antecubital; Blood  Result Value Ref Range Status   Specimen Description RIGHT ANTECUBITAL  Final   Special  Requests   Final    BOTTLES DRAWN AEROBIC AND ANAEROBIC Blood Culture adequate volume   Culture   Final    NO GROWTH 2 DAYS Performed at [facility], [address], [place], [place] [address]    Report Status PENDING  Incomplete  Culture, blood (routine x 2)     Status: None (Preliminary result)   Collection Time: 03/03/21  8:30 AM   Specimen: BLOOD RIGHT WRIST  Result Value Ref Range Status   Specimen Description BLOOD RIGHT WRIST  Final   Special Requests   Final    BOTTLES DRAWN AEROBIC AND ANAEROBIC Blood Culture adequate volume   Culture   Final    NO GROWTH 2 DAYS Performed at [facility], [address] [place], [place] [address]    Report Status PENDING  Incomplete   Time coordinating discharge: 40 mins   SIGNED:  Irwin Brakeman, MD  [facility] 03/05/2021, 1:09 PM How to contact the [facility] Attending or Consulting provider [place] or covering provider during after hours [place], for this patient?  Check the care team in [facility] and look for a) attending/consulting [facility] provider listed and b) the [facility] team listed Log into [email or web address] and use [facility]'s universal password to access. If you do not have the password, please contact the hospital operator. Locate the [facility] provider you are looking for under [facility] and page to a number that you can be directly reached. If you still have difficulty reaching the provider, please page the [facility] (Director on Call) for the Hospitalists listed on amion for assistance.

## 2021-03-06 LAB — FACTOR 5 LEIDEN

## 2021-03-07 ENCOUNTER — Other Ambulatory Visit: Payer: Self-pay

## 2021-03-07 ENCOUNTER — Other Ambulatory Visit (HOSPITAL_COMMUNITY)
Admission: RE | Admit: 2021-03-07 | Discharge: 2021-03-07 | Disposition: A | Discharge status: Home / Self Care | Payer: 59 | Source: Ambulatory Visit | Attending: Student | Admitting: Student

## 2021-03-07 ENCOUNTER — Telehealth: Payer: Self-pay

## 2021-03-07 DIAGNOSIS — Z79899 Other long term (current) drug therapy: Secondary | ICD-10-CM | POA: Insufficient documentation

## 2021-03-07 LAB — BASIC METABOLIC PANEL
Anion gap: 8 (ref 5–15)
BUN: 17 mg/dL (ref 6–20)
CO2: 27 mmol/L (ref 22–32)
Calcium: 8.9 mg/dL (ref 8.9–10.3)
Chloride: 102 mmol/L (ref 98–111)
Creatinine, Ser: 1.48 mg/dL — ABNORMAL HIGH (ref 0.61–1.24)
GFR, Estimated: 55 mL/min — ABNORMAL LOW (ref >60)
Glucose, Bld: 101 mg/dL — ABNORMAL HIGH (ref 70–99)
Potassium: 4.2 mmol/L (ref 3.5–5.1)
Sodium: 137 mmol/L (ref 135–145)

## 2021-03-07 NOTE — Telephone Encounter (Signed)
-----   Message from Ellsworth Lennox, [place] sent at 03/07/2021  1:08 PM EST ----- Please let the patient know that his electrolytes remain stable and kidney function is starting to improve when compared to his recent hospitalization as creatinine was elevated at 1.53 and coming down to 1.48 today. Continue current medication regimen at this time.

## 2021-03-07 NOTE — Telephone Encounter (Signed)
Patient notified and verbalized understanding. Pt had no questions or concerns at this time. PCP copied.  ?

## 2021-03-07 NOTE — Telephone Encounter (Signed)
NOTES SCANNED TO REFERRAL 

## 2021-03-08 LAB — CULTURE, BLOOD (ROUTINE X 2)
Culture: NO GROWTH
Culture: NO GROWTH
Special Requests: ADEQUATE
Special Requests: ADEQUATE

## 2021-03-10 LAB — PROTHROMBIN GENE MUTATION

## 2021-03-10 LAB — FACTOR 5 LEIDEN

## 2021-03-11 ENCOUNTER — Telehealth: Payer: Self-pay | Admitting: Student

## 2021-03-11 DIAGNOSIS — Z0279 Encounter for issue of other medical certificate: Secondary | ICD-10-CM

## 2021-03-11 NOTE — Telephone Encounter (Signed)
Forms from [employer] received on 03/10/2021. Competed patient authorization attached. Took form to MD box for completion.  [facility] 03/11/2021

## 2021-03-12 ENCOUNTER — Encounter: Payer: Self-pay | Admitting: Student

## 2021-03-12 DIAGNOSIS — I428 Other cardiomyopathies: Secondary | ICD-10-CM | POA: Insufficient documentation

## 2021-03-13 NOTE — Telephone Encounter (Signed)
Forms completes and faxed to [employer] on 03/12/21 [facility] 03/13/21

## 2021-03-17 NOTE — Telephone Encounter (Signed)
Received form from [employer] requesting copies of pt's medical records from March 02, 2021 to present- as of this time pt's next apt is not till 03/26/2021  Will fax form back w/ the information [employer] requested

## 2021-03-25 NOTE — Progress Notes (Signed)
Cardiology Office Note    Date:  03/26/2021   ID:  Nicolas Curtis, DOB 10-17-1960, MRN [id number]  PCP:  Sandi Mariscal, MD  Cardiologist: Carlyle Dolly, MD    Chief Complaint  Patient presents with   Hospitalization Follow-up    History of Present Illness:    Nicolas Curtis is a 61 y.o. male with past medical history of PUD and recently diagnosed atrial fibrillation who presents to the office today for hospital follow-up.  He most recently presented to [facility] ED on 03/02/2021 for evaluation of new onset right flank pain and was found to have a right splenic infarct by CT imaging. EKG during admission did show a new diagnosis of atrial fibrillation and he was started on Eliquis for anticoagulation. Was also started on Toprol-XL 25 mg twice daily for rate control and he did convert back to normal sinus rhythm during admission. Echocardiogram showed his EF was mildly reduced at 45 to 50% with global hypokinesis.He did have mild MR but no significant valve abnormalities. Was also started on Losartan 12.5 mg daily for his cardiomyopathy.   In talking with the patient and his wife today, he reports overall feeling well since his recent admission. Says he does have an occasional paresthesia type sensation along his left pectoral region and this spontaneously resolves but did occur on the day he went into atrial fibrillation. He does have a smart watch and by review of this today, he was in normal sinus rhythm on 03/22/2021 but has been in atrial fibrillation for the past few days with his heart rate ranging from the low 100's to 120's. He denies any specific palpitations. No reported dyspnea on exertion, orthopnea, PND or lower extremity edema. No recent dizziness or presyncope.  He reports good compliance with Eliquis and denies any evidence of active bleeding. We did discuss stopping ASA today given the need for anticoagulation.  Past Medical History:  Diagnosis Date   History of stomach ulcers     PAF (paroxysmal atrial fibrillation) ([place])    a. diagnosed in 03/2021 during admission for splenic infarct   Ruptured disc, thoracic     Past Surgical History:  Procedure Laterality Date   COLONOSCOPY     ESOPHAGOGASTRODUODENOSCOPY     FOOT SURGERY Right    spacer placed between first and second digits   gastric ulcer     scoliosis      Current Medications: Outpatient Medications Prior to Visit  Medication Sig Dispense Refill   apixaban (ELIQUIS) 5 MG TABS tablet Take 1 tablet (5 mg total) by mouth 2 (two) times daily. 60 tablet 1   atorvastatin (LIPITOR) 40 MG tablet Take 1 tablet (40 mg total) by mouth every evening. 30 tablet 1   losartan (COZAAR) 25 MG tablet Take 0.5 tablets (12.5 mg total) by mouth daily. 15 tablet 1   Omeprazole (PRILOSEC PO) Take by mouth.     aspirin EC 81 MG tablet Take 81 mg by mouth daily. Swallow whole.     metoprolol succinate (TOPROL-XL) 25 MG 24 hr tablet Take 1 tablet (25 mg total) by mouth 2 (two) times daily. 60 tablet 1   No facility-administered medications prior to visit.     Allergies:   Codeine and Oxycodone   Social History   Socioeconomic History   Marital status: Married    Spouse name: Not on file   Number of children: Not on file   Years of education: Not on file   Highest education level:  Not on file  Occupational History   Not on file  Tobacco Use   Smoking status: Former   Smokeless tobacco: Never  Vaping Use   Vaping Use: Never used  Substance and Sexual Activity   Alcohol use: Yes    Comment: occasional   Drug use: No   Sexual activity: Not on file  Other Topics Concern   Not on file  Social History Narrative   Not on file   Social Determinants of Health   Financial Resource Strain: Not on file  Food Insecurity: Not on file  Transportation Needs: Not on file  Physical Activity: Not on file  Stress: Not on file  Social Connections: Not on file     Family History:  The patient's family history includes  Atrial fibrillation in his mother.   Review of Systems:    Please see the history of present illness.     All other systems reviewed and are otherwise negative except as noted above.   Physical Exam:    VS:  BP (!) 142/80    Pulse (!) 126    Ht 5\' 11"  (1.803 m)    Wt 223 lb 6.4 oz (101.3 kg)    SpO2 98%    BMI 31.16 kg/m    General: Well developed, well nourished,male appearing in no acute distress. Head: Normocephalic, atraumatic. Neck: No carotid bruits. JVD not elevated.  Lungs: Respirations regular and unlabored, without wheezes or rales.  Heart: Irregularly irregular. No S3 or S4.  No murmur, no rubs, or gallops appreciated. Abdomen: Appears non-distended. No obvious abdominal masses. Msk:  Strength and tone appear normal for age. No obvious joint deformities or effusions. Extremities: No clubbing or cyanosis. No edema.  Distal pedal pulses are 2+ bilaterally. Neuro: Alert and oriented X 3. Moves all extremities spontaneously. No focal deficits noted. Psych:  Responds to questions appropriately with a normal affect. Skin: No rashes or lesions noted  Wt Readings from Last 3 Encounters:  03/26/21 223 lb 6.4 oz (101.3 kg)  03/03/21 219 lb 9.3 oz (99.6 kg)  06/09/13 226 lb (102.5 kg)     Studies/Labs Reviewed:   EKG:  EKG is ordered today.  The ekg ordered today demonstrates atrial fibrillation with RVR, HR 126 with slight TWI along the inferior leads.   Recent Labs: 03/04/2021: ALT 32; Hemoglobin 17.8; Magnesium 2.1; Platelets 225; TSH 3.193 03/07/2021: BUN 17; Creatinine, Ser 1.48; Potassium 4.2; Sodium 137   Lipid Panel    Component Value Date/Time   CHOL 226 (H) 03/05/2021 0539   TRIG 161 (H) 03/05/2021 0539   HDL 39 (L) 03/05/2021 0539   CHOLHDL 5.8 03/05/2021 0539   VLDL 32 03/05/2021 0539   LDLCALC 155 (H) 03/05/2021 0539    Additional studies/ records that were reviewed today include:   Echocardiogram: 03/03/2021 IMPRESSIONS     1. Left ventricular  ejection fraction, by estimation, is 45 to 50%. The  left ventricle has low normal to mildly decreased function. The left  ventricle demonstrates global hypokinesis. Left ventricular diastolic  parameters were normal.   2. Right ventricular systolic function is normal. The right ventricular  size is normal. There is normal pulmonary artery systolic pressure.   3. The mitral valve is normal in structure. Mild mitral valve  regurgitation. No evidence of mitral stenosis.   4. The tricuspid valve is abnormal.   5. The aortic valve is tricuspid. Aortic valve regurgitation is not  visualized. No aortic stenosis is present.   6.  The inferior vena cava is normal in size with greater than 50%  respiratory variability, suggesting right atrial pressure of 3 mmHg.   Assessment:    1. PAF (paroxysmal atrial fibrillation) ([place])   2. Tachycardia-bradycardia syndrome ([place])   3. NICM (nonischemic cardiomyopathy) ([place])   4. Hyperlipidemia LDL goal <70   5. CKD (chronic kidney disease) stage 2, GFR 60-89 ml/min      Plan:   In order of problems listed above:  1. Paroxysmal Atrial Fibrillation with RVR complicated by Tachybrady syndrome - He was diagnosed with atrial fibrillation during his admission as he was found to have a splenic infarct. He did convert back to NSR during admission and by review of his EKG's was in normal sinus rhythm until 4 days ago. His heart rate was variable during today's office visit from the low 100's to 120's but he is bradycardic with heart rate in the 50's when in normal sinus rhythm by review of his EKG strips.  - I did review his case with Dr. Domenic Polite (DOD) and will plan to increase Toprol-XL to 37.5 mg twice daily. I encouraged him to check his heart rate prior to taking this and only take 25 mg twice daily if his heart rate is less than 60. Will refer to the [facility] for consideration of antiarrhythmic therapy. - He denies any evidence of active  bleeding. Continue Eliquis 5 mg twice daily for anticoagulation  2. Presumed NICM - Echocardiogram during his recent admission showed his EF was mildly reduced at 45 to 50% and felt to be tachycardia mediated in the setting of atrial fibrillation with RVR. He denies any specific anginal symptoms. - Dr. 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 He reports he is actually meeting with the DOT tomorrow to review his clearance and I encouraged him to make [place] aware if they require a stress test prior to being cleared as this can be arranged. Would likely recommend a Lexiscan Myoview as his rates are suboptimally controlled for Coronary CT and he would have to hold Toprol-XL for an exercise stress test which would not be ideal in the setting of atrial fibrillation with RVR. He denies any dizziness or presyncope, therefore the atrial fibrillation itself should not prohibit driving at this time. - Will titrate Toprol-XL to 37.5 mg twice daily as outlined above and continue Losartan 12.5 mg daily.  3. HLD - He did have aortic atherosclerosis during admission and FLP showed total cholesterol at 226 and LDL at 155. He was started on Atorvastatin 40 mg daily and will need a repeat FLP and LFT's at this time of his next visit if not obtained by his PCP in the interim.   4. Stage 2 CKD - His creatinine had peaked at 1.53 during admission, improved to 1.48 following discharge. Continue current medication regimen for now. Did not further titrate Losartan today given titration of AV nodal blocking agents.    Medication Adjustments/Labs and Tests Ordered: Current medicines are reviewed at length with the patient today.  Concerns regarding medicines are outlined above.  Medication changes, Labs and Tests ordered today are listed in the Patient Instructions below. Patient Instructions  Medication Instructions:   Increase Toprol-XL to  37.5mg  (1.5 tablets) twice daily. If your heart rate is less than 60, only take 25mg  twice daily.   *If you need a refill on your cardiac medications before your next  appointment, please call your pharmacy*  Lab Work:  None  Testing/Procedures:  None  Follow-Up: At [facility], you and your health needs are our priority.  As part of our continuing mission to provide you with exceptional heart care, we have created designated Provider Care Teams.  These Care Teams include your primary Cardiologist (physician) and Advanced Practice Providers (APPs -  Physician Assistants and Nurse Practitioners) who all work together to provide you with the care you need, when you need it.  We recommend signing up for the patient portal called "MyChart".  Sign up information is provided on this After Visit Summary.  MyChart is used to connect with patients for Virtual Visits (Telemedicine).  Patients are able to view lab/test results, encounter notes, upcoming appointments, etc.  Non-urgent messages can be sent to your provider as well.   To learn more about what you can do with MyChart, go to [email or web address].    Your next appointment:    Atrial Fibrillation Clinic on 04/02/2021 at 2:00 PM. At [facility] in [facility].   Follow-up with Dr. Harl Bowie in 3 months.     Signed, Erma Heritage, PA-C  03/26/2021 5:19 PM    [place] [address] [place], [place] [address] Phone: [phone] Fax: [phone]

## 2021-03-26 ENCOUNTER — Encounter: Payer: Self-pay | Admitting: Student

## 2021-03-26 ENCOUNTER — Ambulatory Visit: Payer: 59 | Admitting: Student

## 2021-03-26 ENCOUNTER — Other Ambulatory Visit: Payer: Self-pay

## 2021-03-26 VITALS — BP 142/80 | HR 126 | Ht 71.0 in | Wt 223.4 lb

## 2021-03-26 DIAGNOSIS — I428 Other cardiomyopathies: Secondary | ICD-10-CM

## 2021-03-26 DIAGNOSIS — I495 Sick sinus syndrome: Secondary | ICD-10-CM

## 2021-03-26 DIAGNOSIS — I480 Paroxysmal atrial fibrillation: Secondary | ICD-10-CM | POA: Diagnosis not present

## 2021-03-26 DIAGNOSIS — E785 Hyperlipidemia, unspecified: Secondary | ICD-10-CM

## 2021-03-26 DIAGNOSIS — N182 Chronic kidney disease, stage 2 (mild): Secondary | ICD-10-CM

## 2021-03-26 MED ORDER — METOPROLOL SUCCINATE ER 25 MG PO TB24: 37.5000 mg | ORAL_TABLET | Freq: Two times a day (BID) | ORAL | 3 refills | Status: DC

## 2021-03-26 NOTE — Patient Instructions (Signed)
Medication Instructions:   Increase Toprol-XL to 37.5mg  (1.5 tablets) twice daily. If your heart rate is less than 60, only take 25mg  twice daily.   *If you need a refill on your cardiac medications before your next appointment, please call your pharmacy*  Lab Work:  None  Testing/Procedures:  None  Follow-Up: At [facility], you and your health needs are our priority.  As part of our continuing mission to provide you with exceptional heart care, we have created designated Provider Care Teams.  These Care Teams include your primary Cardiologist (physician) and Advanced Practice Providers (APPs -  Physician Assistants and Nurse Practitioners) who all work together to provide you with the care you need, when you need it.  We recommend signing up for the patient portal called "MyChart".  Sign up information is provided on this After Visit Summary.  MyChart is used to connect with patients for Virtual Visits (Telemedicine).  Patients are able to view lab/test results, encounter notes, upcoming appointments, etc.  Non-urgent messages can be sent to your provider as well.   To learn more about what you can do with MyChart, go to [email or web address].    Your next appointment:    Atrial Fibrillation Clinic on 04/02/2021 at 2:00 PM. At the [facility] in [facility].   Follow-up with Dr. Harl Bowie in 3 months.

## 2021-04-02 ENCOUNTER — Other Ambulatory Visit: Payer: Self-pay

## 2021-04-02 ENCOUNTER — Ambulatory Visit (HOSPITAL_COMMUNITY)
Admission: RE | Admit: 2021-04-02 | Discharge: 2021-04-02 | Disposition: A | Discharge status: Home / Self Care | Payer: 59 | Source: Ambulatory Visit | Attending: Nurse Practitioner | Admitting: Nurse Practitioner

## 2021-04-02 VITALS — BP 104/60 | HR 81 | Ht 71.0 in | Wt 226.4 lb

## 2021-04-02 DIAGNOSIS — I4819 Other persistent atrial fibrillation: Secondary | ICD-10-CM | POA: Diagnosis present

## 2021-04-02 DIAGNOSIS — Z87891 Personal history of nicotine dependence: Secondary | ICD-10-CM | POA: Diagnosis not present

## 2021-04-02 DIAGNOSIS — I10 Essential (primary) hypertension: Secondary | ICD-10-CM | POA: Diagnosis not present

## 2021-04-02 DIAGNOSIS — D6869 Other thrombophilia: Secondary | ICD-10-CM

## 2021-04-02 DIAGNOSIS — Z7901 Long term (current) use of anticoagulants: Secondary | ICD-10-CM | POA: Diagnosis not present

## 2021-04-02 DIAGNOSIS — N280 Ischemia and infarction of kidney: Secondary | ICD-10-CM | POA: Insufficient documentation

## 2021-04-03 ENCOUNTER — Encounter (HOSPITAL_COMMUNITY): Payer: Self-pay | Admitting: Nurse Practitioner

## 2021-04-03 NOTE — Progress Notes (Signed)
Primary Care Physician: Salli Real, MD Referring Physician: Randall An, PA   Nicolas Curtis is a 61 y.o. male with a h/o HTN, petic ulcer disease, that was admitted 03/02/21 to 03/05/21 with acute onset rt side abdominal pain. He was dx with rt renal infarct.   On the evening, 03/03/21, he developed afib with RVR and he was started on IV lopressor which has been transitioned to po metoprolol.  Apixaban started. Pt was told it was likely that the renal infarct was 2/2 afib.    The patient is a former smoker quitting 17 years ago.  Used to drink daily, but quit drinking 2 weeks ago.  He is a long distant truck driver by trade.  He denies any illicit drugs.  He was d/c in [place] but one week ago he went into persistent afib. Rate controlled. He is aware of this by his watch. Ekg today shows rate controlled afib. His BB was recently increased to slow rate.   He was referred here to discuss antiarrythmic. His option for AAD would be Tikosyn with a recent EF of 45%.  However, pt is a long distance truck driver and I feel he should be considered for a front line ablation. His trucking company has put out of work x 3 months until his  afib can be fully evaluated.    Today, he denies symptoms of palpitations, chest pain, shortness of breath, orthopnea, PND, lower extremity edema, dizziness, presyncope, syncope, or neurologic sequela. The patient is tolerating medications without difficulties and is otherwise without complaint today.   Past Medical History:  Diagnosis Date   History of stomach ulcers    PAF (paroxysmal atrial fibrillation) (HCC)    a. diagnosed in 03/2021 during admission for splenic infarct   Ruptured disc, thoracic    Past Surgical History:  Procedure Laterality Date   COLONOSCOPY     ESOPHAGOGASTRODUODENOSCOPY     FOOT SURGERY Right    spacer placed between first and second digits   gastric ulcer     scoliosis      Current Outpatient Medications  Medication Sig Dispense Refill    apixaban (ELIQUIS) 5 MG TABS tablet Take 1 tablet (5 mg total) by mouth 2 (two) times daily. 60 tablet 1   atorvastatin (LIPITOR) 40 MG tablet Take 1 tablet (40 mg total) by mouth every evening. 30 tablet 1   Ibuprofen (ADVIL PO) Take 2 tablets by mouth 2 (two) times a week.     losartan (COZAAR) 25 MG tablet Take 0.5 tablets (12.5 mg total) by mouth daily. 15 tablet 1   metoprolol succinate (TOPROL XL) 25 MG 24 hr tablet Take 1.5 tablets (37.5 mg total) by mouth 2 (two) times daily. 270 tablet 3   Omeprazole (PRILOSEC PO) Take 1 tablet by mouth 4 (four) times a week.     No current facility-administered medications for this encounter.    Allergies  Allergen Reactions   Codeine Rash   Oxycodone Rash    Social History   Socioeconomic History   Marital status: Married    Spouse name: Not on file   Number of children: Not on file   Years of education: Not on file   Highest education level: Not on file  Occupational History   Not on file  Tobacco Use   Smoking status: Former   Smokeless tobacco: Never  Vaping Use   Vaping Use: Never used  Substance and Sexual Activity   Alcohol use: Yes  Comment: occasional   Drug use: No   Sexual activity: Not on file  Other Topics Concern   Not on file  Social History Narrative   Not on file   Social Determinants of Health   Financial Resource Strain: Not on file  Food Insecurity: Not on file  Transportation Needs: Not on file  Physical Activity: Not on file  Stress: Not on file  Social Connections: Not on file  Intimate Partner Violence: Not on file    Family History  Problem Relation Age of Onset   Atrial fibrillation Mother     ROS- All systems are reviewed and negative except as per the HPI above  Physical Exam: Vitals:   04/02/21 1349  BP: 104/60  Pulse: 81  Weight: 102.7 kg  Height: 5\' 11"  (1.803 m)   Wt Readings from Last 3 Encounters:  04/02/21 102.7 kg  03/26/21 101.3 kg  03/03/21 99.6 kg     Labs: Lab Results  Component Value Date   NA 137 03/07/2021   K 4.2 03/07/2021   CL 102 03/07/2021   CO2 27 03/07/2021   GLUCOSE 101 (H) 03/07/2021   BUN 17 03/07/2021   CREATININE 1.48 (H) 03/07/2021   CALCIUM 8.9 03/07/2021   MG 2.1 03/04/2021   No results found for: INR Lab Results  Component Value Date   CHOL 226 (H) 03/05/2021   HDL 39 (L) 03/05/2021   LDLCALC 155 (H) 03/05/2021   TRIG 161 (H) 03/05/2021     GEN- The patient is well appearing, alert and oriented x 3 today.   Head- normocephalic, atraumatic Eyes-  Sclera clear, conjunctiva pink Ears- hearing intact Oropharynx- clear Neck- supple, no JVP Lymph- no cervical lymphadenopathy Lungs- Clear to ausculation bilaterally, normal work of breathing Heart- Regular rate and rhythm, no murmurs, rubs or gallops, PMI not laterally displaced GI- soft, NT, ND, + BS Extremities- no clubbing, cyanosis, or edema MS- no significant deformity or atrophy Skin- no rash or lesion Psych- euthymic mood, full affect Neuro- strength and sensation are intact  EKG-Vent. rate 81 BPM PR interval * ms QRS duration 86 ms QT/QTcB 364/422 ms P-R-T axes * 32 9 Atrial fibrillation Abnormal ECG  Echo-  1. Left ventricular ejection fraction, by estimation, is 45 to 50%. The  left ventricle has low normal to mildly decreased function. The left  ventricle demonstrates global hypokinesis. Left ventricular diastolic  parameters were normal.   2. Right ventricular systolic function is normal. The right ventricular  size is normal. There is normal pulmonary artery systolic pressure.   3. The mitral valve is normal in structure. Mild mitral valve  regurgitation. No evidence of mitral stenosis.   4. The tricuspid valve is abnormal.   5. The aortic valve is tricuspid. Aortic valve regurgitation is not  visualized. No aortic stenosis is present.   6. The inferior vena cava is normal in size with greater than 50%  respiratory  variability, suggesting right atrial pressure of 3 mmHg.    Assessment and Plan:  1. New onset  afib  Dx at time of renal infarct He left hospital in SR but for the last week he has been in persistent afib He is rate controlled  Continue metoprolol succinate 25 mg 1 1/2 tabs bid It was referred here to discuss AAD's, best option is Tikosyn, but the fact that he is a long distance truck driver and Tikosyn can be pro arrhythmic, it may be best for him to be evaluated for front  line ablation  He will be referred to EP in a timely manner as he has been put out of work  x 3 months until afib can be fully evaluated/treated  2. CHA2DS2VASc score of at least 4 Continue eliquis 5 mg bid for treatment of renal infarct as well as to address risk of stroke associated with afib   3. HTN Stable   Appointment with Dr. Lalla Brothers pending 2/15  Elvina Sidle. [facility] [address] [place], [place] [address] [phone]

## 2021-04-16 ENCOUNTER — Encounter: Payer: Self-pay | Admitting: *Deleted

## 2021-04-16 ENCOUNTER — Encounter: Payer: Self-pay | Admitting: Cardiology

## 2021-04-16 ENCOUNTER — Ambulatory Visit: Payer: 59 | Admitting: Cardiology

## 2021-04-16 ENCOUNTER — Other Ambulatory Visit: Payer: Self-pay

## 2021-04-16 VITALS — BP 122/64 | HR 103 | Ht 71.0 in | Wt 227.8 lb

## 2021-04-16 DIAGNOSIS — I4891 Unspecified atrial fibrillation: Secondary | ICD-10-CM

## 2021-04-16 DIAGNOSIS — Z01818 Encounter for other preprocedural examination: Secondary | ICD-10-CM

## 2021-04-16 DIAGNOSIS — I4819 Other persistent atrial fibrillation: Secondary | ICD-10-CM | POA: Diagnosis not present

## 2021-04-16 MED ORDER — METOPROLOL SUCCINATE ER 50 MG PO TB24: 50.0000 mg | ORAL_TABLET | Freq: Two times a day (BID) | ORAL | 3 refills | Status: DC

## 2021-04-16 NOTE — Progress Notes (Signed)
Electrophysiology Office Note:    Date:  04/16/2021   ID:  Nicolas Curtis, DOB 1960-10-22, MRN [id number]  PCP:  Nicolas Mariscal, MD  [facility] Cardiologist:  Nicolas Dolly, MD  [facility] Electrophysiologist:  None   Referring MD: Nicolas Needs, NP   Chief Complaint: Atrial fibrillation  History of Present Illness:    Nicolas Curtis is a 61 y.o. male who presents for an evaluation of Afib ablation at the request of Nicolas Palau, NP. Their medical history includes paroxysmal atrial fibrillation, hypertension, and gastric ulcers.  Nicolas Curtis was seen 04/02/2021 by Nicolas Palau, NP. His hospitalization 03/02/21-03/05/21 was reviewed. He was diagnosed with right renal infarct, and he developed atrial fibrillation with RVR on 03/03/21. Started on IV lopressor then transitioned to po metoprolol. Apixaban was also started. He was in SR on discharge, but he reverted to persistent Afib one week prior to his visit with Nicolas Palau, NP. EKG showed rate controlled Afib. 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 He was referred to EP for consideration of a front line ablation.   He is accompanied by a family member today. Overall, he appears well. Typically he is unable to tell when he is arrhythmic.  A few days ago he reports his FitBit watch noted he was in sinus rhythm. On personal review, his tracings from Jan. 21, Feb. 11th and Feb. 12th all show sinus rhythm. In clinic today, a current EKG tracing was performed with his FitBit, which showed a ventricular rate of 108 bpm.  He denies any chest pain, shortness of breath, or peripheral edema. No lightheadedness, headaches, syncope, orthopnea, or PND.     Past Medical History:  Diagnosis Date   History of stomach ulcers    PAF (paroxysmal atrial fibrillation) ([place])    a. diagnosed in 03/2021 during admission for splenic infarct   Ruptured disc, thoracic     Past  Surgical History:  Procedure Laterality Date   COLONOSCOPY     ESOPHAGOGASTRODUODENOSCOPY     FOOT SURGERY Right    spacer placed between first and second digits   gastric ulcer     scoliosis      Current Medications: Current Meds  Medication Sig   apixaban (ELIQUIS) 5 MG TABS tablet Take 1 tablet (5 mg total) by mouth 2 (two) times daily.   atorvastatin (LIPITOR) 40 MG tablet Take 1 tablet (40 mg total) by mouth every evening.   Ibuprofen (ADVIL PO) Take 2 tablets by mouth 2 (two) times a week.   losartan (COZAAR) 25 MG tablet Take 0.5 tablets (12.5 mg total) by mouth daily.   metoprolol succinate (TOPROL-XL) 50 MG 24 hr tablet Take 1 tablet (50 mg total) by mouth in the morning and at bedtime. Take with or immediately following a meal.   Omeprazole (PRILOSEC PO) Take 1 tablet by mouth 4 (four) times a week.   [DISCONTINUED] metoprolol succinate (TOPROL XL) 25 MG 24 hr tablet Take 1.5 tablets (37.5 mg total) by mouth 2 (two) times daily.     Allergies:   Codeine and Oxycodone   Social History   Socioeconomic History   Marital status: Married    Spouse name: Not on file   Number of children: Not on file   Years of education: Not on file   Highest education level: Not on file  Occupational History   Not on file  Tobacco Use  Smoking status: Former   Smokeless tobacco: Never  Scientific laboratory technician Use: Never used  Substance and Sexual Activity   Alcohol use: Yes    Comment: occasional   Drug use: No   Sexual activity: Not on file  Other Topics Concern   Not on file  Social History Narrative   Not on file   Social Determinants of Health   Financial Resource Strain: Not on file  Food Insecurity: Not on file  Transportation Curtis: Not on file  Physical Activity: Not on file  Stress: Not on file  Social Connections: Not on file     Family History: The patient's family history includes Atrial fibrillation in his mother.  ROS:   Please see the history of present  illness.    All other systems reviewed and are negative.  EKGs/Labs/Other Studies Reviewed:    The following studies were reviewed today:  Bilateral LE Venous Doppler 03/03/2021: FINDINGS: RIGHT LOWER EXTREMITY   Common Femoral Vein: No evidence of thrombus. Normal compressibility, respiratory phasicity and response to augmentation.   Saphenofemoral Junction: No evidence of thrombus. Normal compressibility and flow on color Doppler imaging.   Profunda Femoral Vein: No evidence of thrombus. Normal compressibility and flow on color Doppler imaging.   Femoral Vein: No evidence of thrombus. Normal compressibility, respiratory phasicity and response to augmentation.   Popliteal Vein: No evidence of thrombus. Normal compressibility, respiratory phasicity and response to augmentation.   Calf Veins: No evidence of thrombus. Normal compressibility and flow on color Doppler imaging.   LEFT LOWER EXTREMITY   Common Femoral Vein: No evidence of thrombus. Normal compressibility, respiratory phasicity and response to augmentation.   Saphenofemoral Junction: No evidence of thrombus. Normal compressibility and flow on color Doppler imaging.   Profunda Femoral Vein: No evidence of thrombus. Normal compressibility and flow on color Doppler imaging.   Femoral Vein: No evidence of thrombus. Normal compressibility, respiratory phasicity and response to augmentation.   Popliteal Vein: No evidence of thrombus. Normal compressibility, respiratory phasicity and response to augmentation.   Calf Veins: No evidence of thrombus. Normal compressibility and flow on color Doppler imaging.   IMPRESSION: No evidence of deep venous thrombosis in either lower extremity.  Echo 03/03/2021:  1. Left ventricular ejection fraction, by estimation, is 45 to 50%. The  left ventricle has low normal to mildly decreased function. The left  ventricle demonstrates global hypokinesis. Left ventricular diastolic   parameters were normal.   2. Right ventricular systolic function is normal. The right ventricular  size is normal. There is normal pulmonary artery systolic pressure.   3. The mitral valve is normal in structure. Mild mitral valve  regurgitation. No evidence of mitral stenosis.   4. The tricuspid valve is abnormal.   5. The aortic valve is tricuspid. Aortic valve regurgitation is not  visualized. No aortic stenosis is present.   6. The inferior vena cava is normal in size with greater than 50%  respiratory variability, suggesting right atrial pressure of 3 mmHg.  CTA Abdomen 03/02/2021: IMPRESSION: 1. 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 2. Atherosclerotic changes of the abdominal aorta without aneurysm or stenosis. Aortic Atherosclerosis (ICD10-I70.0). 3. Stable appearance of low-density in the posterior right kidney. This likely represents a focal infarct as previously described. 4. Hepatic steatosis. 5. Cholelithiasis without cholecystitis. 6. Colonic diverticulosis without diverticulitis.  EKG:   EKG is personally reviewed.  04/16/2021: Atrial fibrillation, PVC.  Ventricular rate 103 bpm   Recent Labs: 03/04/2021: ALT 32; Hemoglobin 17.8; Magnesium 2.1; Platelets 225; TSH 3.193 03/07/2021: BUN 17; Creatinine, Ser 1.48; Potassium 4.2; Sodium 137   Recent Lipid Panel    Component Value Date/Time   CHOL 226 (H) 03/05/2021 0539   TRIG 161 (H) 03/05/2021 0539   HDL 39 (L) 03/05/2021 0539   CHOLHDL 5.8 03/05/2021 0539   VLDL 32 03/05/2021 0539   LDLCALC 155 (H) 03/05/2021 0539    Physical Exam:    VS:  BP 122/64    Pulse (!) 103    Ht 5\' 11"  (1.803 m)    Wt 227 lb 12.8 oz (103.3 kg)    SpO2 98%    BMI 31.77 kg/m     Wt Readings from Last 3 Encounters:  04/16/21 227 lb 12.8 oz (103.3 kg)  04/02/21 226 lb 6.4 oz (102.7 kg)  03/26/21 223 lb 6.4 oz (101.3 kg)     GEN: Well nourished, well  developed in no acute distress HEENT: Normal NECK: No JVD; No carotid bruits LYMPHATICS: No lymphadenopathy CARDIAC: Irregularly irregular, no murmurs, rubs, gallops RESPIRATORY:  Clear to auscultation without rales, wheezing or rhonchi  ABDOMEN: Soft, non-tender, non-distended MUSCULOSKELETAL:  No edema; No deformity  SKIN: Warm and dry NEUROLOGIC:  Alert and oriented x 3 PSYCHIATRIC:  Normal affect       ASSESSMENT:    1. Persistent atrial fibrillation ([place])   2. Pre-op evaluation   3. Atrial fibrillation, unspecified type ([place])    PLAN:    In order of problems listed above:  #Persistent atrial fibrillation Symptomatic.  I discussed the treatment strategies for persistent atrial fibrillation during today's clinic visit including cardioversion, antiarrhythmic drugs and catheter ablation.  I specifically discussed amiodarone and Tikosyn given the persistent nature of his atrial fibrillation.  I discussed the catheter ablation procedure in detail with the patient including the risk, recovery and likelihood of success.  I am encouraged by the fact that his Fitbit recorded sinus rhythm tracings within the last month.  After an extensive discussion, the patient and his family have decided to pursue catheter ablation which I think is a very reasonable strategy.  I discussed the possibility that after an ablation procedure he may need a repeat procedure or could require antiarrhythmic drug therapy.  Risk, benefits, and alternatives to EP study and radiofrequency ablation for afib were also discussed in detail today. These risks include but are not limited to stroke, bleeding, vascular damage, tamponade, perforation, damage to the esophagus, lungs, and other structures, pulmonary vein stenosis, worsening renal function, and death. The patient understands these risk and wishes to proceed.  We will therefore proceed with catheter ablation at the next available time.  Carto, ICE, anesthesia are  requested for the procedure.  Will also obtain CT PV protocol prior to the procedure to exclude LAA thrombus and further evaluate atrial anatomy.  He will continue Eliquis uninterrupted.  Today we will increase his Toprol-XL to 50 mg by mouth twice daily to hopefully achieve some additional rate control.  Total time spent with patient today 65 minutes. This includes reviewing records, evaluating the patient and coordinating care.  Medication Adjustments/Labs and Tests Ordered: Current medicines are reviewed at length with the patient today.  Concerns regarding medicines are outlined above.   Orders Placed This Encounter  Procedures   CT CARDIAC MORPH/PULM VEIN W/CM&W/O CA SCORE   CBC w/Diff   Basic Metabolic Panel (BMET)   EKG 12-Lead   Meds  ordered this encounter  Medications   metoprolol succinate (TOPROL-XL) 50 MG 24 hr tablet    Sig: Take 1 tablet (50 mg total) by mouth in the morning and at bedtime. Take with or immediately following a meal.    Dispense:  180 tablet    Refill:  3   I,Mathew Stumpf,acting as a scribe for Vickie Epley, MD.,have documented all relevant documentation on the behalf of Vickie Epley, MD,as directed by  Vickie Epley, MD while in the presence of Vickie Epley, MD.  I, Vickie Epley, MD, have reviewed all documentation for this visit. The documentation on 04/16/21 for the exam, diagnosis, procedures, and orders are all accurate and complete.   Signed, Hilton Cork. Quentin Ore, MD, [facility], [facility] 04/16/2021 7:41 PM    Electrophysiology [facility]

## 2021-04-16 NOTE — Patient Instructions (Addendum)
Medication Instructions:  Increase Metoprolol succinate to 50 mg two times  Your physician recommends that you continue on your current medications as directed. Please refer to the Current Medication list given to you today. *If you need a refill on your cardiac medications before your next appointment, please call your pharmacy*  Lab Work: None. If you have labs (blood work) drawn today and your tests are completely normal, you will receive your results only by: [place] (if you have MyChart) OR A paper copy in the mail If you have any lab test that is abnormal or we need to change your treatment, we will call you to review the results.  Testing/Procedures: Your physician has requested that you have cardiac CT. Cardiac computed tomography (CT) is a painless test that uses an x-ray machine to take clear, detailed pictures of your heart. For further information please visit [email or web address]. Please follow instruction sheet as given.  Your physician has recommended that you have an ablation. Catheter ablation is a medical procedure used to treat some cardiac arrhythmias (irregular heartbeats). During catheter ablation, a long, thin, flexible tube is put into a blood vessel in your groin (upper thigh), or neck. This tube is called an ablation catheter. It is then guided to your heart through the blood vessel. Radio frequency waves destroy small areas of heart tissue where abnormal heartbeats may cause an arrhythmia to start. Please see the instruction sheet given to you today.   Follow-Up: At [facility], you and your health needs are our priority.  As part of our continuing mission to provide you with exceptional heart care, we have created designated Provider Care Teams.  These Care Teams include your primary Cardiologist (physician) and Advanced Practice Providers (APPs -  Physician Assistants and Nurse Practitioners) who all work together to provide you with the care you need, when  you need it.  Your physician wants you to follow-up in: see instruction letter.   We recommend signing up for the patient portal called "MyChart".  Sign up information is provided on this After Visit Summary.  MyChart is used to connect with patients for Virtual Visits (Telemedicine).  Patients are able to view lab/test results, encounter notes, upcoming appointments, etc.  Non-urgent messages can be sent to your provider as well.   To learn more about what you can do with MyChart, go to [email or web address].    Any Other Special Instructions Will Be Listed Below (If Applicable).  Cardiac Ablation Cardiac ablation is a procedure to destroy (ablate) some heart tissue that is sending bad signals. These bad signals cause problems in heart rhythm. The heart has many areas that make these signals. If there are problems in these areas, they can make the heart beat in a way that is not normal. Destroying some tissues can help make the heart rhythm normal. Tell your doctor about: Any allergies you have. All medicines you are taking. These include vitamins, herbs, eye drops, creams, and over-the-counter medicines. Any problems you or family members have had with medicines that make you fall asleep (anesthetics). Any blood disorders you have. Any surgeries you have had. Any medical conditions you have, such as kidney failure. Whether you are pregnant or may be pregnant. What are the risks? This is a safe procedure. But problems may occur, including: Infection. Bruising and bleeding. Bleeding into the chest. Stroke or blood clots. Damage to nearby areas of your body. Allergies to medicines or dyes. The need for a pacemaker if the normal system is damaged.  Failure of the procedure to treat the problem. What happens before the procedure? Medicines Ask your doctor about: Changing or stopping your normal medicines. This is important. Taking aspirin and ibuprofen. Do not take these medicines  unless your doctor tells you to take them. Taking other medicines, vitamins, herbs, and supplements. General instructions Follow instructions from your doctor about what you cannot eat or drink. Plan to have someone take you home from the hospital or clinic. If you will be going home right after the procedure, plan to have someone with you for 24 hours. Ask your doctor what steps will be taken to prevent infection. What happens during the procedure?  An IV tube will be put into one of your veins. You will be given a medicine to help you relax. The skin on your neck or groin will be numbed. A cut (incision) will be made in your neck or groin. A needle will be put through your cut and into a large vein. A tube (catheter) will be put into the needle. The tube will be moved to your heart. Dye may be put through the tube. This helps your doctor see your heart. Small devices (electrodes) on the tube will send out signals. A type of energy will be used to destroy some heart tissue. The tube will be taken out. Pressure will be held on your cut. This helps stop bleeding. A bandage will be put over your cut. The exact procedure may vary among doctors and hospitals. What happens after the procedure? You will be watched until you leave the hospital or clinic. This includes checking your heart rate, breathing rate, oxygen, and blood pressure. Your cut will be watched for bleeding. You will need to lie still for a few hours. Do not drive for 24 hours or as long as your doctor tells you. Summary Cardiac ablation is a procedure to destroy some heart tissue. This is done to treat heart rhythm problems. Tell your doctor about any medical conditions you may have. Tell him or her about all medicines you are taking to treat them. This is a safe procedure. But problems may occur. These include infection, bruising, bleeding, and damage to nearby areas of your body. Follow what your doctor tells you about food  and drink. You may also be told to change or stop some of your medicines. After the procedure, do not drive for 24 hours or as long as your doctor tells you. This information is not intended to replace advice given to you by your health care provider. Make sure you discuss any questions you have with your health care provider. Document Revised: 01/19/2019 Document Reviewed: 01/19/2019 Elsevier Patient Education  2022 [employer].

## 2021-05-05 ENCOUNTER — Other Ambulatory Visit (HOSPITAL_COMMUNITY): Payer: Self-pay | Admitting: *Deleted

## 2021-05-05 MED ORDER — APIXABAN 5 MG PO TABS: 5.0000 mg | ORAL_TABLET | Freq: Two times a day (BID) | ORAL | 3 refills | Status: DC

## 2021-05-05 MED ORDER — LOSARTAN POTASSIUM 25 MG PO TABS: 12.5000 mg | ORAL_TABLET | Freq: Every day | ORAL | 3 refills | Status: DC

## 2021-06-09 ENCOUNTER — Other Ambulatory Visit: Payer: 59 | Admitting: *Deleted

## 2021-06-09 DIAGNOSIS — Z01818 Encounter for other preprocedural examination: Secondary | ICD-10-CM

## 2021-06-09 DIAGNOSIS — I4819 Other persistent atrial fibrillation: Secondary | ICD-10-CM

## 2021-06-09 DIAGNOSIS — I4891 Unspecified atrial fibrillation: Secondary | ICD-10-CM

## 2021-06-09 LAB — BASIC METABOLIC PANEL
BUN/Creatinine Ratio: 11 (ref 9–20)
BUN: 13 mg/dL (ref 6–24)
CO2: 24 mmol/L (ref 20–29)
Calcium: 9.7 mg/dL (ref 8.7–10.2)
Chloride: 101 mmol/L (ref 96–106)
Creatinine, Ser: 1.23 mg/dL (ref 0.76–1.27)
Glucose: 109 mg/dL — ABNORMAL HIGH (ref 70–99)
Potassium: 4.5 mmol/L (ref 3.5–5.2)
Sodium: 137 mmol/L (ref 134–144)
eGFR: 68 mL/min/{1.73_m2} (ref >59)

## 2021-06-09 LAB — CBC WITH DIFFERENTIAL/PLATELET
Basophils Absolute: 0.1 10*3/uL (ref 0.0–0.2)
Basos: 1 %
EOS (ABSOLUTE): 0.2 10*3/uL (ref 0.0–0.4)
Eos: 3 %
Hematocrit: 51.5 % — ABNORMAL HIGH (ref 37.5–51.0)
Hemoglobin: 18.3 g/dL — ABNORMAL HIGH (ref 13.0–17.7)
Immature Grans (Abs): 0 10*3/uL (ref 0.0–0.1)
Immature Granulocytes: 0 %
Lymphocytes Absolute: 2.7 10*3/uL (ref 0.7–3.1)
Lymphs: 32 %
MCH: 32.2 pg (ref 26.6–33.0)
MCHC: 35.5 g/dL (ref 31.5–35.7)
MCV: 91 fL (ref 79–97)
Monocytes Absolute: 1 10*3/uL — ABNORMAL HIGH (ref 0.1–0.9)
Monocytes: 12 %
Neutrophils Absolute: 4.6 10*3/uL (ref 1.4–7.0)
Neutrophils: 52 %
Platelets: 252 10*3/uL (ref 150–450)
RBC: 5.68 x10E6/uL (ref 4.14–5.80)
RDW: 13.8 % (ref 11.6–15.4)
WBC: 8.7 10*3/uL (ref 3.4–10.8)

## 2021-06-25 ENCOUNTER — Telehealth (HOSPITAL_COMMUNITY): Payer: Self-pay | Admitting: *Deleted

## 2021-06-25 NOTE — Telephone Encounter (Signed)
Attempted to call patient regarding upcoming cardiac CT appointment. ?Wife answered the phone and stated the patient was out. Left phone number and name for patient to call back. ? ?Larey Brick RN Navigator Cardiac Imaging ?[facility] ?[phone] Office ?[phone] Cell ? ?

## 2021-06-25 NOTE — Telephone Encounter (Signed)
Patient returning call regarding upcoming cardiac imaging study; pt verbalizes understanding of appt date/time, parking situation and where to check in, pre-test NPO status; name and call back number provided for further questions should they arise ? ?Gordy Clement RN Navigator Cardiac Imaging ?[facility] ?[phone] office ?[phone] cell ? ?Patient is aware to arrive at 7:30am for his scan. ?

## 2021-06-26 ENCOUNTER — Encounter (HOSPITAL_COMMUNITY): Payer: Self-pay

## 2021-06-26 ENCOUNTER — Ambulatory Visit (HOSPITAL_COMMUNITY)
Admission: RE | Admit: 2021-06-26 | Discharge: 2021-06-26 | Disposition: A | Discharge status: Home / Self Care | Payer: 59 | Source: Ambulatory Visit | Attending: Cardiology | Admitting: Cardiology

## 2021-06-26 DIAGNOSIS — I4891 Unspecified atrial fibrillation: Secondary | ICD-10-CM | POA: Insufficient documentation

## 2021-06-26 MED ORDER — IOHEXOL 350 MG/ML SOLN
100.0000 mL | Freq: Once | INTRAVENOUS | Status: AC | PRN
  Administered 2021-06-26: 100 mL via INTRAVENOUS

## 2021-07-02 ENCOUNTER — Ambulatory Visit: Payer: 59 | Admitting: Cardiology

## 2021-07-02 NOTE — Pre-Procedure Instructions (Signed)
Instructed patient on the following items: Arrival time 0530 Nothing to eat or drink after midnight No meds AM of procedure Responsible person to drive you home and stay with you for 24 hrs  Have you missed any doses of anti-coagulant Eliquis- hasn't missed any doses    

## 2021-07-03 ENCOUNTER — Ambulatory Visit (HOSPITAL_COMMUNITY)
Admission: RE | Admit: 2021-07-03 | Discharge: 2021-07-03 | Disposition: A | Discharge status: Home / Self Care | Payer: 59 | Attending: Cardiology | Admitting: Cardiology

## 2021-07-03 ENCOUNTER — Encounter (HOSPITAL_COMMUNITY)
Admission: RE | Disposition: A | Discharge status: Home / Self Care | Payer: 59 | Source: Home / Self Care | Attending: Cardiology

## 2021-07-03 ENCOUNTER — Ambulatory Visit (HOSPITAL_COMMUNITY): Payer: 59 | Admitting: Anesthesiology

## 2021-07-03 ENCOUNTER — Ambulatory Visit (HOSPITAL_BASED_OUTPATIENT_CLINIC_OR_DEPARTMENT_OTHER): Payer: 59 | Admitting: Anesthesiology

## 2021-07-03 ENCOUNTER — Other Ambulatory Visit: Payer: Self-pay

## 2021-07-03 DIAGNOSIS — N289 Disorder of kidney and ureter, unspecified: Secondary | ICD-10-CM

## 2021-07-03 DIAGNOSIS — Z79899 Other long term (current) drug therapy: Secondary | ICD-10-CM | POA: Diagnosis not present

## 2021-07-03 DIAGNOSIS — I4819 Other persistent atrial fibrillation: Secondary | ICD-10-CM | POA: Insufficient documentation

## 2021-07-03 DIAGNOSIS — I4891 Unspecified atrial fibrillation: Secondary | ICD-10-CM

## 2021-07-03 DIAGNOSIS — Z7901 Long term (current) use of anticoagulants: Secondary | ICD-10-CM | POA: Diagnosis not present

## 2021-07-03 DIAGNOSIS — I10 Essential (primary) hypertension: Secondary | ICD-10-CM | POA: Diagnosis not present

## 2021-07-03 HISTORY — PX: ATRIAL FIBRILLATION ABLATION: EP1191

## 2021-07-03 SURGERY — ATRIAL FIBRILLATION ABLATION
Anesthesia: General

## 2021-07-03 MED ORDER — SODIUM CHLORIDE 0.9% FLUSH: 3.0000 mL | Freq: Two times a day (BID) | INTRAVENOUS | Status: DC

## 2021-07-03 MED ORDER — PANTOPRAZOLE SODIUM 40 MG PO TBEC
40.0000 mg | DELAYED_RELEASE_TABLET | Freq: Every day | ORAL | Status: DC
  Administered 2021-07-03: 40 mg via ORAL
  Filled 2021-07-03: qty 1

## 2021-07-03 MED ORDER — LACTATED RINGERS IV SOLN: INTRAVENOUS | Status: DC | PRN

## 2021-07-03 MED ORDER — MIDAZOLAM HCL 2 MG/2ML IJ SOLN
INTRAMUSCULAR | Status: DC | PRN
  Administered 2021-07-03: 2 mg via INTRAVENOUS

## 2021-07-03 MED ORDER — SODIUM CHLORIDE 0.9 % IV SOLN: 250.0000 mL | INTRAVENOUS | Status: DC | PRN

## 2021-07-03 MED ORDER — ONDANSETRON HCL 4 MG/2ML IJ SOLN: 4.0000 mg | Freq: Four times a day (QID) | INTRAMUSCULAR | Status: DC | PRN

## 2021-07-03 MED ORDER — PHENYLEPHRINE HCL-NACL 20-0.9 MG/250ML-% IV SOLN
INTRAVENOUS | Status: DC | PRN
  Administered 2021-07-03: 25 ug/min via INTRAVENOUS

## 2021-07-03 MED ORDER — PHENYLEPHRINE 80 MCG/ML (10ML) SYRINGE FOR IV PUSH (FOR BLOOD PRESSURE SUPPORT)
PREFILLED_SYRINGE | INTRAVENOUS | Status: DC | PRN
  Administered 2021-07-03: 160 ug via INTRAVENOUS

## 2021-07-03 MED ORDER — ISOPROTERENOL HCL 0.2 MG/ML IJ SOLN
INTRAMUSCULAR | Status: AC
  Filled 2021-07-03: qty 5

## 2021-07-03 MED ORDER — SODIUM CHLORIDE 0.9% FLUSH: 3.0000 mL | INTRAVENOUS | Status: DC | PRN

## 2021-07-03 MED ORDER — PROPOFOL 10 MG/ML IV BOLUS
INTRAVENOUS | Status: DC | PRN
  Administered 2021-07-03: 200 mg via INTRAVENOUS

## 2021-07-03 MED ORDER — HEPARIN (PORCINE) IN NACL 1000-0.9 UT/500ML-% IV SOLN
INTRAVENOUS | Status: AC
  Filled 2021-07-03: qty 500

## 2021-07-03 MED ORDER — SUGAMMADEX SODIUM 200 MG/2ML IV SOLN
INTRAVENOUS | Status: DC | PRN
  Administered 2021-07-03: 200 mg via INTRAVENOUS

## 2021-07-03 MED ORDER — HEPARIN SODIUM (PORCINE) 1000 UNIT/ML IJ SOLN
INTRAMUSCULAR | Status: DC | PRN
  Administered 2021-07-03 (×2): 4000 [IU] via INTRAVENOUS
  Administered 2021-07-03: 16000 [IU] via INTRAVENOUS

## 2021-07-03 MED ORDER — APIXABAN 5 MG PO TABS
5.0000 mg | ORAL_TABLET | Freq: Two times a day (BID) | ORAL | Status: DC
  Administered 2021-07-03: 5 mg via ORAL
  Filled 2021-07-03: qty 1

## 2021-07-03 MED ORDER — COLCHICINE 0.6 MG PO TABS
0.6000 mg | ORAL_TABLET | Freq: Two times a day (BID) | ORAL | Status: DC
  Administered 2021-07-03: 0.6 mg via ORAL
  Filled 2021-07-03: qty 1

## 2021-07-03 MED ORDER — HEPARIN (PORCINE) IN NACL 2-0.9 UNITS/ML
INTRAMUSCULAR | Status: AC | PRN
  Administered 2021-07-03 (×4): 500 mL

## 2021-07-03 MED ORDER — PROTAMINE SULFATE 10 MG/ML IV SOLN
INTRAVENOUS | Status: DC | PRN
  Administered 2021-07-03: 35 mg via INTRAVENOUS

## 2021-07-03 MED ORDER — FENTANYL CITRATE (PF) 100 MCG/2ML IJ SOLN
INTRAMUSCULAR | Status: DC | PRN
Start: 2021-07-03 — End: 2021-07-03
  Administered 2021-07-03: 100 ug via INTRAVENOUS

## 2021-07-03 MED ORDER — HEPARIN SODIUM (PORCINE) 1000 UNIT/ML IJ SOLN
INTRAMUSCULAR | Status: AC
  Filled 2021-07-03: qty 10

## 2021-07-03 MED ORDER — SODIUM CHLORIDE 0.9 % IV SOLN: INTRAVENOUS | Status: DC

## 2021-07-03 MED ORDER — HEPARIN SODIUM (PORCINE) 1000 UNIT/ML IJ SOLN
INTRAMUSCULAR | Status: DC | PRN
  Administered 2021-07-03: 1000 [IU] via INTRAVENOUS

## 2021-07-03 MED ORDER — LIDOCAINE 2% (20 MG/ML) 5 ML SYRINGE
INTRAMUSCULAR | Status: DC | PRN
  Administered 2021-07-03: 60 mg via INTRAVENOUS

## 2021-07-03 MED ORDER — DEXAMETHASONE SODIUM PHOSPHATE 10 MG/ML IJ SOLN
INTRAMUSCULAR | Status: DC | PRN
  Administered 2021-07-03: 5 mg via INTRAVENOUS

## 2021-07-03 MED ORDER — ROCURONIUM BROMIDE 10 MG/ML (PF) SYRINGE
PREFILLED_SYRINGE | INTRAVENOUS | Status: DC | PRN
  Administered 2021-07-03: 60 mg via INTRAVENOUS

## 2021-07-03 MED ORDER — ONDANSETRON HCL 4 MG/2ML IJ SOLN
INTRAMUSCULAR | Status: DC | PRN
  Administered 2021-07-03: 4 mg via INTRAVENOUS

## 2021-07-03 MED ORDER — ACETAMINOPHEN 325 MG PO TABS: 650.0000 mg | ORAL_TABLET | ORAL | Status: DC | PRN

## 2021-07-03 MED ORDER — ISOPROTERENOL HCL 0.2 MG/ML IJ SOLN
INTRAVENOUS | Status: DC | PRN
  Administered 2021-07-03: 2 ug/min via INTRAVENOUS

## 2021-07-03 MED ORDER — PANTOPRAZOLE SODIUM 40 MG PO TBEC: 40.0000 mg | DELAYED_RELEASE_TABLET | Freq: Every day | ORAL | 0 refills | Status: DC

## 2021-07-03 MED ORDER — COLCHICINE 0.6 MG PO TABS: 0.6000 mg | ORAL_TABLET | Freq: Two times a day (BID) | ORAL | 0 refills | Status: DC

## 2021-07-03 MED ORDER — FENTANYL CITRATE (PF) 250 MCG/5ML IJ SOLN: INTRAMUSCULAR | Status: DC | PRN

## 2021-07-03 SURGICAL SUPPLY — 19 items
BLANKET WARM UNDERBOD FULL ACC (MISCELLANEOUS) ×2 IMPLANT
CATH OCTARAY 2.0 F 3-3-3-3-3 (CATHETERS) ×1 IMPLANT
CATH S CIRCA THERM PROBE 10F (CATHETERS) ×1 IMPLANT
CATH SMTCH THERMOCOOL SF DF (CATHETERS) ×1 IMPLANT
CATH SOUNDSTAR ECO 8FR (CATHETERS) ×1 IMPLANT
CATH WEBSTER BI DIR CS D-F CRV (CATHETERS) ×1 IMPLANT
CLOSURE PERCLOSE PROSTYLE (VASCULAR PRODUCTS) ×3 IMPLANT
COVER SWIFTLINK CONNECTOR (BAG) ×2 IMPLANT
MAT PREVALON FULL STRYKER (MISCELLANEOUS) ×1 IMPLANT
PACK EP LATEX FREE (CUSTOM PROCEDURE TRAY) ×2
PACK EP LF (CUSTOM PROCEDURE TRAY) ×1 IMPLANT
PAD DEFIB RADIO PHYSIO CONN (PAD) ×2 IMPLANT
PATCH CARTO3 (PAD) ×1 IMPLANT
SHEATH BAYLIS TRANSSEPTAL 98CM (NEEDLE) ×1 IMPLANT
SHEATH CARTO VIZIGO SM CVD (SHEATH) ×1 IMPLANT
SHEATH PINNACLE 8F 10CM (SHEATH) ×2 IMPLANT
SHEATH PINNACLE 9F 10CM (SHEATH) ×1 IMPLANT
SHEATH PROBE COVER 6X72 (BAG) ×1 IMPLANT
TUBING SMART ABLATE COOLFLOW (TUBING) ×1 IMPLANT

## 2021-07-03 NOTE — Anesthesia Procedure Notes (Signed)
Procedure Name: Intubation ?Date/Time: 07/03/2021 7:41 AM ?Performed by: Valda Favia, CRNA ?Pre-anesthesia Checklist: Patient identified, Emergency Drugs available, Suction available and Patient being monitored ?Patient Re-evaluated:Patient Re-evaluated prior to induction ?Oxygen Delivery Method: Circle System Utilized ?Preoxygenation: Pre-oxygenation with 100% oxygen ?Induction Type: IV induction ?Ventilation: Two handed mask ventilation required and Oral airway inserted - appropriate to patient size ?Laryngoscope Size: Mac and 4 ?Grade View: Grade I ?Tube type: Oral ?Tube size: 7.5 mm ?Number of attempts: 1 ?Airway Equipment and Method: Stylet and Oral airway ?Placement Confirmation: ETT inserted through vocal cords under direct vision, positive ETCO2 and breath sounds checked- equal and bilateral ?Secured at: 23 cm ?Tube secured with: Tape ?Dental Injury: Teeth and Oropharynx as per pre-operative assessment  ? ? ? ? ?

## 2021-07-03 NOTE — Progress Notes (Signed)
Pt has ambulated without bleeding or difficulty.  Waiting for Dr Quentin Ore to see before discharge. ?

## 2021-07-03 NOTE — Discharge Instructions (Signed)

## 2021-07-03 NOTE — Progress Notes (Signed)
Otilio Saber ,PA assessed pt and approved for discharge. Pt discharged home with wife who will drive pt home and stay with him x 24 hrs. ?

## 2021-07-03 NOTE — Anesthesia Preprocedure Evaluation (Signed)
Anesthesia Evaluation  ?Patient identified by MRN, date of birth, ID band ?Patient awake ? ? ? ?Reviewed: ?Allergy & Precautions, NPO status , Patient's Chart, lab work & pertinent test results ? ?Airway ?Mallampati: II ? ?TM Distance: >3 FB ?Neck ROM: Full ? ? ? Dental ? ?(+) Dental Advisory Given ?  ?Pulmonary ?former smoker,  ?  ?breath sounds clear to auscultation ? ? ? ? ? ? Cardiovascular ?hypertension, Pt. on medications and Pt. on home beta blockers ?+ dysrhythmias Atrial Fibrillation  ?Rhythm:Regular Rate:Normal ? ?EF 45-50%. Mild MR. Mild TR. ?  ?Neuro/Psych ?negative neurological ROS ?   ? GI/Hepatic ?Neg liver ROS, GERD  ,  ?Endo/Other  ?negative endocrine ROS ? Renal/GU ?Renal disease  ? ?  ?Musculoskeletal ? ? Abdominal ?  ?Peds ? Hematology ?negative hematology ROS ?(+)   ?Anesthesia Other Findings ? ? Reproductive/Obstetrics ? ?  ? ? ? ? ? ? ? ? ? ? ? ? ? ?  ?  ? ? ? ? ? ? ? ? ?Lab Results  ?Component Value Date  ? WBC 8.7 06/09/2021  ? HGB 18.3 (H) 06/09/2021  ? HCT 51.5 (H) 06/09/2021  ? MCV 91 06/09/2021  ? PLT 252 06/09/2021  ? ?Lab Results  ?Component Value Date  ? CREATININE 1.23 06/09/2021  ? BUN 13 06/09/2021  ? NA 137 06/09/2021  ? K 4.5 06/09/2021  ? CL 101 06/09/2021  ? CO2 24 06/09/2021  ? ? ?Anesthesia Physical ?Anesthesia Plan ? ?ASA: 3 ? ?Anesthesia Plan: General  ? ?Post-op Pain Management: Tylenol PO (pre-op)* and Minimal or no pain anticipated  ? ?Induction: Intravenous ? ?PONV Risk Score and Plan: 2 and Dexamethasone, Ondansetron and Treatment may vary due to age or medical condition ? ?Airway Management Planned: Oral ETT ? ?Additional Equipment: None ? ?Intra-op Plan:  ? ?Post-operative Plan: Extubation in OR ? ?Informed Consent: I have reviewed the patients History and Physical, chart, labs and discussed the procedure including the risks, benefits and alternatives for the proposed anesthesia with the patient or authorized representative who has  indicated his/her understanding and acceptance.  ? ? ? ?Dental advisory given ? ?Plan Discussed with: CRNA ? ?Anesthesia Plan Comments:   ? ? ? ? ? ? ?Anesthesia Quick Evaluation ? ?

## 2021-07-03 NOTE — Transfer of Care (Signed)
Immediate Anesthesia Transfer of Care Note ? ?Patient: Nicolas Curtis ? ?Procedure(s) Performed: ATRIAL FIBRILLATION ABLATION ? ?Patient Location: Cath Lab ? ?Anesthesia Type:General ? ?Level of Consciousness: awake, alert  and oriented ? ?Airway & Oxygen Therapy: Patient Spontanous Breathing and Patient connected to nasal cannula oxygen ? ?Post-op Assessment: Report given to RN and Post -op Vital signs reviewed and stable ? ?Post vital signs: Reviewed and stable ? ?Last Vitals:  ?Vitals Value Taken Time  ?BP    ?Temp    ?Pulse 65 07/03/21 1003  ?Resp 7 07/03/21 1003  ?SpO2 97 % 07/03/21 1003  ?Vitals shown include unvalidated device data. ? ?Last Pain:  ?Vitals:  ? 07/03/21 0539  ?TempSrc: Oral  ?   ? ?  ? ?Complications: No notable events documented. ?

## 2021-07-03 NOTE — H&P (Signed)
?Electrophysiology Office Note:   ?  ?Date:  07/03/2021  ?  ?ID:  Nicolas Curtis, DOB 1960-11-23, MRN [id number] ?  ?PCP:  Nicolas Real, MD            ?[facility] Cardiologist:  Nicolas Rich, MD  ?[facility] Electrophysiologist:  None  ?  ?Referring MD: Nicolas Nip, NP  ?  ?Chief Complaint: Atrial fibrillation ?  ?History of Present Illness:   ?  ?Nicolas Curtis is a 61 y.o. male who presents for an evaluation of Afib ablation at the request of Nicolas Coco, NP. Their medical history includes paroxysmal atrial fibrillation, hypertension, and gastric ulcers. ?  ?[name] was seen 04/02/2021 by Nicolas Coco, NP. His hospitalization 03/02/21-03/05/21 was reviewed. He was diagnosed with right renal infarct, and he developed atrial fibrillation with RVR on 03/03/21. Started on IV lopressor then transitioned to po metoprolol. Apixaban was also started. He was in SR on discharge, but he reverted to persistent Afib one week prior to his visit with Nicolas Coco, NP. EKG showed rate controlled Afib. 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 He was referred to EP for consideration of a front line ablation.  ?  ?He is accompanied by a family member today. Overall, he appears well. Typically he is unable to tell when he is arrhythmic. ?  ?A few days ago he reports his FitBit watch noted he was in sinus rhythm. On personal review, his tracings from Jan. 21, Feb. 11th and Feb. 12th all show sinus rhythm. In clinic today, a current EKG tracing was performed with his FitBit, which showed a ventricular rate of 108 bpm. ?  ?He denies any chest pain, shortness of breath, or peripheral edema. No lightheadedness, headaches, syncope, orthopnea, or PND. ?  ?Presents today for PVI. No problems with his AC since I last saw him.  ?  ?  ?Objective  ?  ?    ?Past Medical History:  ?Diagnosis Date  ? History of stomach ulcers    ? PAF (paroxysmal atrial  fibrillation) (HCC)    ?  a. diagnosed in 03/2021 during admission for splenic infarct  ? Ruptured disc, thoracic    ?  ?  ?     ?Past Surgical History:  ?Procedure Laterality Date  ? COLONOSCOPY      ? ESOPHAGOGASTRODUODENOSCOPY      ? FOOT SURGERY Right    ?  spacer placed between first and second digits  ? gastric ulcer      ? scoliosis      ?  ?  ?Current Medications: ?Active Medications  ?    ?Current Meds  ?Medication Sig  ? apixaban (ELIQUIS) 5 MG TABS tablet Take 1 tablet (5 mg total) by mouth 2 (two) times daily.  ? atorvastatin (LIPITOR) 40 MG tablet Take 1 tablet (40 mg total) by mouth every evening.  ? Ibuprofen (ADVIL PO) Take 2 tablets by mouth 2 (two) times a week.  ? losartan (COZAAR) 25 MG tablet Take 0.5 tablets (12.5 mg total) by mouth daily.  ? metoprolol succinate (TOPROL-XL) 50 MG 24 hr tablet Take 1 tablet (50 mg total) by mouth in the morning and at bedtime. Take with or immediately following a meal.  ? Omeprazole (PRILOSEC PO) Take 1 tablet by mouth 4 (four) times a week.  ? [DISCONTINUED] metoprolol succinate (TOPROL XL) 25 MG 24 hr tablet Take 1.5 tablets (  37.5 mg total) by mouth 2 (two) times daily.  ?  ?  ?  ?Allergies:   Codeine and Oxycodone  ?  ?Social History  ?  ?     ?Socioeconomic History  ? Marital status: Married  ?    Spouse name: Not on file  ? Number of children: Not on file  ? Years of education: Not on file  ? Highest education level: Not on file  ?Occupational History  ? Not on file  ?Tobacco Use  ? Smoking status: Former  ? Smokeless tobacco: Never  ?Vaping Use  ? Vaping Use: Never used  ?Substance and Sexual Activity  ? Alcohol use: Yes  ?    Comment: occasional  ? Drug use: No  ? Sexual activity: Not on file  ?Other Topics Concern  ? Not on file  ?Social History Narrative  ? Not on file  ?  ?Social Determinants of Health  ?  ?Financial Resource Strain: Not on file  ?Food Insecurity: Not on file  ?Transportation Needs: Not on file  ?Physical Activity: Not on file   ?Stress: Not on file  ?Social Connections: Not on file  ?  ?  ?Family History: ?The patient's family history includes Atrial fibrillation in his mother. ?  ?ROS:   ?Please see the history of present illness.    ?All other systems reviewed and are negative. ?  ?EKGs/Labs/Other Studies Reviewed:   ?  ?The following studies were reviewed today: ?  ?Echo 03/03/2021: ? 1. Left ventricular ejection fraction, by estimation, is 45 to 50%. The  ?left ventricle has low normal to mildly decreased function. The left  ?ventricle demonstrates global hypokinesis. Left ventricular diastolic  ?parameters were normal.  ? 2. Right ventricular systolic function is normal. The right ventricular  ?size is normal. There is normal pulmonary artery systolic pressure.  ? 3. The mitral valve is normal in structure. Mild mitral valve  ?regurgitation. No evidence of mitral stenosis.  ? 4. The tricuspid valve is abnormal.  ? 5. The aortic valve is tricuspid. Aortic valve regurgitation is not  ?visualized. No aortic stenosis is present.  ? 6. The inferior vena cava is normal in size with greater than 50%  ?respiratory variability, suggesting right atrial pressure of 3 mmHg. ?  ?CTA Abdomen 03/02/2021: ?IMPRESSION: ?1. Mild narrowing at the origin of the celiac artery without ?significant stenosis relative to the more distal vessel. ?2. Atherosclerotic changes of the abdominal aorta without aneurysm ?or stenosis. ?Aortic Atherosclerosis (ICD10-I70.0). ?3. Stable appearance of low-density in the posterior right kidney. ?This likely represents a focal infarct as previously described. ?4. Hepatic steatosis. ?5. Cholelithiasis without cholecystitis. ?6. Colonic diverticulosis without diverticulitis. ?  ?EKG:   EKG is personally reviewed.  ?04/16/2021: Atrial fibrillation, PVC.  Ventricular rate 103 bpm ?  ?  ?Recent Labs: ?03/04/2021: ALT 32; Hemoglobin 17.8; Magnesium 2.1; Platelets 225; TSH 3.193 ?03/07/2021: BUN 17; Creatinine, Ser 1.48; Potassium 4.2;  Sodium 137  ?  ?Recent Lipid Panel ?Labs (Brief)  ?     ?   ?Component Value Date/Time  ?  CHOL 226 (H) 03/05/2021 0539  ?  TRIG 161 (H) 03/05/2021 0539  ?  HDL 39 (L) 03/05/2021 0539  ?  CHOLHDL 5.8 03/05/2021 0539  ?  VLDL 32 03/05/2021 0539  ?  LDLCALC 155 (H) 03/05/2021 0539  ?  ?  ?  ?Physical Exam:   ?  ?VS:  BP 120/98   Pulse (!) 77   Ht 5\' 11"  (  1.803 m)   Wt 227 lb 12.8 oz (103.3 kg)   SpO2 98%   BMI 31.77 kg/m?    ?  ?   ?Wt Readings from Last 3 Encounters:  ?04/16/21 227 lb 12.8 oz (103.3 kg)  ?04/02/21 226 lb 6.4 oz (102.7 kg)  ?03/26/21 223 lb 6.4 oz (101.3 kg)  ?  ?  ?GEN: Well nourished, well developed in no acute distress ?HEENT: Normal ?NECK: No JVD; No carotid bruits ?LYMPHATICS: No lymphadenopathy ?CARDIAC: Irregularly irregular, no murmurs, rubs, gallops ?RESPIRATORY:  Clear to auscultation without rales, wheezing or rhonchi  ?ABDOMEN: Soft, non-tender, non-distended ?MUSCULOSKELETAL:  No edema; No deformity  ?SKIN: Warm and dry ?NEUROLOGIC:  Alert and oriented x 3 ?PSYCHIATRIC:  Normal affect  ?  ?  ?  ?  ?Assessment ?  ?  ?ASSESSMENT:   ?  ?1. Persistent atrial fibrillation (HCC)   ?2. Pre-op evaluation   ?3. Atrial fibrillation, unspecified type (HCC)   ?  ?PLAN:   ?  ?In order of problems listed above: ?  ?#Persistent atrial fibrillation ?Symptomatic.  I discussed the treatment strategies for persistent atrial fibrillation during today's clinic visit including cardioversion, antiarrhythmic drugs and catheter ablation.  I specifically discussed amiodarone and Tikosyn given the persistent nature of his atrial fibrillation.  I discussed the catheter ablation procedure in detail with the patient including the risk, recovery and likelihood of success.  I am encouraged by the fact that his Fitbit recorded sinus rhythm tracings within the last month. ?  ?After an extensive discussion, the patient and his family have decided to pursue catheter ablation which I think is a very reasonable strategy.   I discussed the possibility that after an ablation procedure he may need a repeat procedure or could require antiarrhythmic drug therapy. ? ?Risk, benefits, and alternatives to EP study and radiofrequency ablation for afib we

## 2021-07-04 ENCOUNTER — Encounter (HOSPITAL_COMMUNITY): Payer: Self-pay | Admitting: Cardiology

## 2021-07-04 LAB — POCT ACTIVATED CLOTTING TIME
Activated Clotting Time: 323 seconds
Activated Clotting Time: 323 seconds
Activated Clotting Time: 353 seconds

## 2021-07-04 NOTE — Anesthesia Postprocedure Evaluation (Signed)
Anesthesia Post Note ? ?Patient: Nicolas Curtis ? ?Procedure(s) Performed: ATRIAL FIBRILLATION ABLATION ? ?  ? ?Patient location during evaluation: PACU ?Anesthesia Type: General ?Level of consciousness: awake and alert ?Pain management: pain level controlled ?Vital Signs Assessment: post-procedure vital signs reviewed and stable ?Respiratory status: spontaneous breathing, nonlabored ventilation, respiratory function stable and patient connected to nasal cannula oxygen ?Cardiovascular status: blood pressure returned to baseline and stable ?Postop Assessment: no apparent nausea or vomiting ?Anesthetic complications: no ? ? ?No notable events documented. ? ?Last Vitals:  ?Vitals:  ? 07/03/21 1400 07/03/21 1450  ?BP: 110/74 99/69  ?Pulse: 63 74  ?Resp: 18 (!) 21  ?Temp:    ?SpO2: 95% 96%  ?  ?Last Pain:  ?Vitals:  ? 07/03/21 1040  ?TempSrc:   ?PainSc: 0-No pain  ? ? ?  ?  ?  ?  ?  ?  ? ?Nicolas Curtis ? ? ? ? ?

## 2021-07-10 DIAGNOSIS — Z0279 Encounter for issue of other medical certificate: Secondary | ICD-10-CM

## 2021-07-15 ENCOUNTER — Telehealth: Payer: Self-pay | Admitting: Cardiology

## 2021-07-15 NOTE — Telephone Encounter (Signed)
Forms and payment were received on 07-10-21 from patient. Placed in "MD's box." ? ?FORMS returned on 07-15-21, patient was called and told documents were ready and at front desk to pick up. Patient stated would come pickup today or tomorrow.  ? ?

## 2021-07-31 ENCOUNTER — Other Ambulatory Visit (HOSPITAL_COMMUNITY): Payer: Self-pay | Admitting: Nurse Practitioner

## 2021-08-09 ENCOUNTER — Other Ambulatory Visit: Payer: Self-pay | Admitting: Student

## 2021-08-12 ENCOUNTER — Encounter (HOSPITAL_COMMUNITY): Payer: Self-pay | Admitting: Nurse Practitioner

## 2021-08-12 ENCOUNTER — Ambulatory Visit (HOSPITAL_COMMUNITY)
Admission: RE | Admit: 2021-08-12 | Discharge: 2021-08-12 | Disposition: A | Discharge status: Home / Self Care | Payer: 59 | Source: Ambulatory Visit | Attending: Nurse Practitioner | Admitting: Nurse Practitioner

## 2021-08-12 VITALS — BP 150/76 | HR 51 | Ht 71.0 in | Wt 231.0 lb

## 2021-08-12 DIAGNOSIS — I10 Essential (primary) hypertension: Secondary | ICD-10-CM | POA: Diagnosis not present

## 2021-08-12 DIAGNOSIS — Z79899 Other long term (current) drug therapy: Secondary | ICD-10-CM | POA: Diagnosis not present

## 2021-08-12 DIAGNOSIS — Z7901 Long term (current) use of anticoagulants: Secondary | ICD-10-CM | POA: Diagnosis not present

## 2021-08-12 DIAGNOSIS — I480 Paroxysmal atrial fibrillation: Secondary | ICD-10-CM | POA: Diagnosis not present

## 2021-08-12 DIAGNOSIS — I4891 Unspecified atrial fibrillation: Secondary | ICD-10-CM | POA: Diagnosis not present

## 2021-08-12 DIAGNOSIS — R0789 Other chest pain: Secondary | ICD-10-CM | POA: Insufficient documentation

## 2021-08-12 DIAGNOSIS — I482 Chronic atrial fibrillation, unspecified: Secondary | ICD-10-CM | POA: Insufficient documentation

## 2021-08-12 DIAGNOSIS — R202 Paresthesia of skin: Secondary | ICD-10-CM | POA: Insufficient documentation

## 2021-08-12 DIAGNOSIS — R079 Chest pain, unspecified: Secondary | ICD-10-CM

## 2021-08-12 DIAGNOSIS — Z841 Family history of disorders of kidney and ureter: Secondary | ICD-10-CM | POA: Insufficient documentation

## 2021-08-12 NOTE — Telephone Encounter (Signed)
Pt's pharmacy is requesting a refill on pantoprazole 40 mg tablet. Would Dr. Lalla Brothers like to refill this medication? Please address

## 2021-08-12 NOTE — Progress Notes (Signed)
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 He was dx with rt renal infarct.   On the evening, 03/03/21, he developed afib with RVR and he was started on IV lopressor which has been transitioned to po metoprolol.  Apixaban started. Pt was told it was likely that the renal infarct was 2/2 afib.    The patient is a former smoker quitting 17 years ago.  Used to drink daily, but quit drinking 2 weeks ago.  He is a long distant truck driver by trade.  He denies any illicit drugs.  He was d/c in SR but one week ago he went into persistent afib. Rate controlled. He is aware of this by his watch. Ekg today shows rate controlled afib. His BB was recently increased to slow rate.   He was referred here to discuss antiarrythmic. His option for AAD would be Tikosyn with a recent EF of 45%.  However, pt is a long distance truck driver and I feel he should be considered for a front line ablation. His trucking company has put out of work x 3 months until his  afib can be fully evaluated.   F/u in afib clinic, 08/12/21. He is one month afib ablation. He has not felt any afib but does c/o of tightness in the center of his chest with radiation to left elbow and tingling in his left and with exertional activities or if gets upset and is relieved with rest.  He did note some similar mid chest  symptoms prior to ablation but now has the left arm involvement.  No rest symptoms. No st changes on ekg. Cardiac CT prior to ablation did not evaluate coronary artery anatomy. BP elevated here but does not check at home to see if white coat HTN is contributing. He has returned to work. No swallowing or groin issues. No alcohol, quit smoking years ago.   Today, he denies symptoms of palpitations, chest pain, shortness of breath,  orthopnea, PND, lower extremity edema, dizziness, presyncope, syncope, or neurologic sequela. The patient is tolerating medications without difficulties and is otherwise without complaint today.   Past Medical History:  Diagnosis Date   History of stomach ulcers    PAF (paroxysmal atrial fibrillation) (HCC)    a. diagnosed in 03/2021 during admission for splenic infarct   Ruptured disc, thoracic    Past Surgical History:  Procedure Laterality Date   ATRIAL FIBRILLATION ABLATION N/A 07/03/2021   Procedure: ATRIAL FIBRILLATION ABLATION;  Surgeon: Lanier Prude, MD;  Location: [facility];  Service: Cardiovascular;  Laterality: N/A;   COLONOSCOPY     ESOPHAGOGASTRODUODENOSCOPY     FOOT SURGERY Right    spacer placed between first and second digits   gastric ulcer     scoliosis      Current Outpatient Medications  Medication Sig Dispense Refill   apixaban (ELIQUIS) 5 MG TABS tablet Take 1 tablet (5 mg total) by mouth 2 (two) times daily. 60 tablet 3   Ibuprofen (ADVIL PO) Take 2 tablets by mouth daily as needed (pain).     losartan (COZAAR) 25 MG tablet TAKE 1/2 TABLET BY MOUTH EVERY DAY 45 tablet 1   metoprolol succinate (TOPROL-XL) 50 MG 24 hr tablet Take 1 tablet (50 mg total) by mouth in  the morning and at bedtime. Take with or immediately following a meal. 180 tablet 3   pantoprazole (PROTONIX) 40 MG tablet Take 1 tablet (40 mg total) by mouth daily. 45 tablet 0   rosuvastatin (CRESTOR) 20 MG tablet Take 20 mg by mouth at bedtime.     No current facility-administered medications for this encounter.    Allergies  Allergen Reactions   Codeine Rash   Oxycodone Rash    Social History   Socioeconomic History   Marital status: Married    Spouse name: Not on file   Number of children: Not on file   Years of education: Not on file   Highest education level: Not on file  Occupational History   Not on file  Tobacco Use   Smoking status: Former   Smokeless tobacco:  Never  Vaping Use   Vaping Use: Never used  Substance and Sexual Activity   Alcohol use: Yes    Comment: occasional   Drug use: No   Sexual activity: Not on file  Other Topics Concern   Not on file  Social History Narrative   Not on file   Social Determinants of Health   Financial Resource Strain: Not on file  Food Insecurity: Not on file  Transportation Needs: Not on file  Physical Activity: Not on file  Stress: Not on file  Social Connections: Not on file  Intimate Partner Violence: Not on file    Family History  Problem Relation Age of Onset   Atrial fibrillation Mother     ROS- All systems are reviewed and negative except as per the HPI above  Physical Exam: Vitals:   08/12/21 0825  Pulse: (!) 51  Weight: 104.8 kg  Height: 5\' 11"  (1.803 m)   Wt Readings from Last 3 Encounters:  08/12/21 104.8 kg  07/03/21 105.2 kg  04/16/21 103.3 kg    Labs: Lab Results  Component Value Date   NA 137 06/09/2021   K 4.5 06/09/2021   CL 101 06/09/2021   CO2 24 06/09/2021   GLUCOSE 109 (H) 06/09/2021   BUN 13 06/09/2021   CREATININE 1.23 06/09/2021   CALCIUM 9.7 06/09/2021   MG 2.1 03/04/2021   No results found for: "INR" Lab Results  Component Value Date   CHOL 226 (H) 03/05/2021   HDL 39 (L) 03/05/2021   LDLCALC 155 (H) 03/05/2021   TRIG 161 (H) 03/05/2021     GEN- The patient is well appearing, alert and oriented x 3 today.   Head- normocephalic, atraumatic Eyes-  Sclera clear, conjunctiva pink Ears- hearing intact Oropharynx- clear Neck- supple, no JVP Lymph- no cervical lymphadenopathy Lungs- Clear to ausculation bilaterally, normal work of breathing Heart- Regular rate and rhythm, no murmurs, rubs or gallops, PMI not laterally displaced GI- soft, NT, ND, + BS Extremities- no clubbing, cyanosis, or edema MS- no significant deformity or atrophy Skin- no rash or lesion Psych- euthymic mood, full affect Neuro- strength and sensation are  intact  EKG-Vent. rate 51 BPM PR interval 126 ms QRS duration 90 ms QT/QTcB 434/400 ms P-R-T axes 12 23 34 Sinus bradycardia with sinus arrhythmia Low voltage QRS Borderline ECG When compared with ECG of 03-Jul-2021 10:06, PREVIOUS ECG IS PRESENT  Echo-  1. Left ventricular ejection fraction, by estimation, is 45 to 50%. The  left ventricle has low normal to mildly decreased function. The left  ventricle demonstrates global hypokinesis. Left ventricular diastolic  parameters were normal.   2. Right ventricular systolic function  is normal. The right ventricular  size is normal. There is normal pulmonary artery systolic pressure.   3. The mitral valve is normal in structure. Mild mitral valve  regurgitation. No evidence of mitral stenosis.   4. The tricuspid valve is abnormal.   5. The aortic valve is tricuspid. Aortic valve regurgitation is not  visualized. No aortic stenosis is present.   6. The inferior vena cava is normal in size with greater than 50%  respiratory variability, suggesting right atrial pressure of 3 mmHg.    Assessment and Plan:  1. New onset  afib  Dx at time of renal infarct 03/2021 Now s/p ablation one month ago and is maintaining  SR.   Continue metoprolol succinate 50 tabs bid  2. CHA2DS2VASc score of at least 4 Continue eliquis 5 mg bid   3. HTN  Elevated here Check at home to get true BP readings  4. Exertional chest discomfort with L arm radiation/tingling No rest symptoms or ekg changes  Will order a lexi/myoview and will inform Dr. Lalla Brothers of symptoms   F/u with Dr. Lalla Brothers at 3 month f/u period   Nicolas Curtis. [facility] [address] [place], [place] [address] [phone]

## 2021-08-13 ENCOUNTER — Telehealth (HOSPITAL_COMMUNITY): Payer: Self-pay | Admitting: *Deleted

## 2021-08-13 ENCOUNTER — Other Ambulatory Visit: Payer: Self-pay | Admitting: *Deleted

## 2021-08-13 ENCOUNTER — Encounter (HOSPITAL_COMMUNITY): Payer: Self-pay | Admitting: Nurse Practitioner

## 2021-08-13 ENCOUNTER — Other Ambulatory Visit (HOSPITAL_COMMUNITY): Payer: Self-pay | Admitting: Nurse Practitioner

## 2021-08-13 ENCOUNTER — Other Ambulatory Visit (HOSPITAL_COMMUNITY): Payer: Self-pay | Admitting: *Deleted

## 2021-08-13 DIAGNOSIS — R079 Chest pain, unspecified: Secondary | ICD-10-CM

## 2021-08-13 NOTE — Telephone Encounter (Signed)
Patient given detailed instructions per Myocardial Perfusion Study Information Sheet for the test on 08/18/2021 at 8:15. Patient notified to arrive 15 minutes early and that it is imperative to arrive on time for appointment to keep from having the test rescheduled.  If you need to cancel or reschedule your appointment, please call the office within 24 hours of your appointment. . Patient verbalized understanding.Nicolas Curtis

## 2021-08-18 ENCOUNTER — Ambulatory Visit (HOSPITAL_COMMUNITY): Payer: 59 | Attending: Cardiovascular Disease

## 2021-08-18 DIAGNOSIS — R079 Chest pain, unspecified: Secondary | ICD-10-CM | POA: Insufficient documentation

## 2021-08-18 LAB — MYOCARDIAL PERFUSION IMAGING
LV dias vol: 100 mL (ref 62–150)
LV sys vol: 52 mL
Nuc Stress EF: 48 %
Peak HR: 67 {beats}/min
Rest HR: 48 {beats}/min
Rest Nuclear Isotope Dose: 10.1 mCi
SDS: 1
SRS: 0
SSS: 1
ST Depression (mm): 0 mm
Stress Nuclear Isotope Dose: 29.7 mCi
TID: 1.05

## 2021-08-18 MED ORDER — TECHNETIUM TC 99M TETROFOSMIN IV KIT
10.1000 | PACK | Freq: Once | INTRAVENOUS | Status: AC | PRN
  Administered 2021-08-18: 10.1 via INTRAVENOUS

## 2021-08-18 MED ORDER — TECHNETIUM TC 99M TETROFOSMIN IV KIT
29.7000 | PACK | Freq: Once | INTRAVENOUS | Status: AC | PRN
  Administered 2021-08-18: 29.7 via INTRAVENOUS

## 2021-08-18 MED ORDER — REGADENOSON 0.4 MG/5ML IV SOLN
0.4000 mg | Freq: Once | INTRAVENOUS | Status: AC
  Administered 2021-08-18: 0.4 mg via INTRAVENOUS

## 2021-08-19 ENCOUNTER — Encounter (HOSPITAL_COMMUNITY): Payer: Self-pay | Admitting: *Deleted

## 2021-09-02 ENCOUNTER — Other Ambulatory Visit: Payer: Self-pay

## 2021-09-02 ENCOUNTER — Emergency Department (HOSPITAL_BASED_OUTPATIENT_CLINIC_OR_DEPARTMENT_OTHER)
Admission: EM | Admit: 2021-09-02 | Discharge: 2021-09-02 | Disposition: A | Discharge status: Home / Self Care | Payer: 59 | Attending: Emergency Medicine | Admitting: Emergency Medicine

## 2021-09-02 DIAGNOSIS — S81812A Laceration without foreign body, left lower leg, initial encounter: Secondary | ICD-10-CM

## 2021-09-02 DIAGNOSIS — W268XXA Contact with other sharp object(s), not elsewhere classified, initial encounter: Secondary | ICD-10-CM | POA: Diagnosis not present

## 2021-09-02 DIAGNOSIS — S8992XA Unspecified injury of left lower leg, initial encounter: Secondary | ICD-10-CM | POA: Diagnosis present

## 2021-09-02 DIAGNOSIS — Z7901 Long term (current) use of anticoagulants: Secondary | ICD-10-CM | POA: Insufficient documentation

## 2021-09-02 MED ORDER — LIDOCAINE-EPINEPHRINE (PF) 2 %-1:200000 IJ SOLN
10.0000 mL | Freq: Once | INTRAMUSCULAR | Status: AC
  Administered 2021-09-02: 10 mL via INTRADERMAL
  Filled 2021-09-02: qty 20

## 2021-09-02 MED ORDER — CEPHALEXIN 500 MG PO CAPS: 500.0000 mg | ORAL_CAPSULE | Freq: Two times a day (BID) | ORAL | 0 refills | Status: AC

## 2021-09-02 MED ORDER — CEPHALEXIN 250 MG PO CAPS
500.0000 mg | ORAL_CAPSULE | Freq: Once | ORAL | Status: AC
  Administered 2021-09-02: 500 mg via ORAL
  Filled 2021-09-02: qty 2

## 2021-09-02 NOTE — ED Triage Notes (Signed)
Reported accidentally got cut from pet pig's tusk, laceration on left lower leg; states on eliquis.

## 2021-09-02 NOTE — ED Notes (Signed)
Pt verbalizes understanding of discharge instructions. Opportunity for questioning and answers were provided. Pt discharged from ED to home with wife.    

## 2021-09-02 NOTE — Discharge Instructions (Addendum)
Keep your stitches clean and dry for 2 days.  After that you can shower and bathe normally with soap and water.  The stitches need to be removed in 8-10 days.  This can be done at your doctor's office, in urgent care, or the ER.

## 2021-09-02 NOTE — ED Provider Notes (Signed)
[facility] EMERGENCY DEPT Provider Note   CSN: [id number] Arrival date & time: 09/02/21  2124     History  Chief Complaint  Patient presents with   Laceration    Nicolas Curtis is a 61 y.o. male who is on Eliquis presenting to the with a laceration of his left lower leg.  He reports his pet pig accidentally lacerated his leg with his thoughts earlier today.  The bleeding is now under control.  HPI     Home Medications Prior to Admission medications   Medication Sig Start Date End Date Taking? Authorizing Provider  cephALEXin (KEFLEX) 500 MG capsule Take 1 capsule (500 mg total) by mouth 2 (two) times daily for 5 days. 09/03/21 09/08/21 Yes Harshika Mago, Kermit Balo, MD  apixaban (ELIQUIS) 5 MG TABS tablet Take 1 tablet (5 mg total) by mouth 2 (two) times daily. 05/05/21   Newman Nip, NP  Ibuprofen (ADVIL PO) Take 2 tablets by mouth daily as needed (pain).    [provider]  losartan (COZAAR) 25 MG tablet TAKE 1/2 TABLET BY MOUTH EVERY DAY 07/31/21   Newman Nip, NP  metoprolol succinate (TOPROL-XL) 50 MG 24 hr tablet Take 1 tablet (50 mg total) by mouth in the morning and at bedtime. Take with or immediately following a meal. 04/16/21   Lanier Prude, MD  rosuvastatin (CRESTOR) 20 MG tablet Take 20 mg by mouth at bedtime.    [provider]      Allergies    Codeine and Oxycodone    Review of Systems   Review of Systems  Physical Exam Updated Vital Signs BP (!) 165/76 (BP Location: Right Arm)   Pulse (!) 59   Temp 98 F (36.7 C) (Oral)   Resp 17   Ht 5\' 11"  (1.803 m)   Wt 105.2 kg   SpO2 99%   BMI 32.36 kg/m  Physical Exam Constitutional:      General: He is not in acute distress. HENT:     Head: Normocephalic and atraumatic.  Eyes:     Conjunctiva/sclera: Conjunctivae normal.     Pupils: Pupils are equal, round, and reactive to light.  Cardiovascular:     Rate and Rhythm: Normal rate and regular rhythm.  Pulmonary:      Effort: Pulmonary effort is normal. No respiratory distress.  Musculoskeletal:     Comments: 3 cm linear laceration to left lower anterior extremity, through subcutaneous fat, does not violate the fascia.  No active bleeding  Skin:    General: Skin is warm and dry.  Neurological:     General: No focal deficit present.     Mental Status: He is alert. Mental status is at baseline.  Psychiatric:        Mood and Affect: Mood normal.        Behavior: Behavior normal.     ED Results / Procedures / Treatments   Labs (all labs ordered are listed, but only abnormal results are displayed) Labs Reviewed - No data to display  EKG None  Radiology No results found.  Procedures . Laceration Repair  Date/Time: 09/02/2021 10:17 PM  Performed by: 11/03/2021, MD Authorized by: Terald Sleeper, MD   Consent:    Consent obtained:  Verbal   Consent given by:  Patient   Risks discussed:  Pain, poor cosmetic result and poor wound healing Universal protocol:    Procedure explained and questions answered to patient or proxy's satisfaction: yes  Immediately prior to procedure, a time out was called: yes     Patient identity confirmed:  Arm band Anesthesia:    Anesthesia method:  Local infiltration   Local anesthetic:  Lidocaine 1% WITH epi Laceration details:    Location:  Leg   Leg location:  L lower leg   Length (cm):  3   Depth (mm):  3 Exploration:    Imaging outcome: foreign body not noted     Wound extent: areolar tissue violated     Wound extent: no fascia violation noted, no foreign bodies/material noted, no muscle damage noted, no nerve damage noted, no tendon damage noted, no underlying fracture noted and no vascular damage noted     Contaminated: no   Treatment:    Area cleansed with:  Saline   Amount of cleaning:  Standard Skin repair:    Repair method:  Sutures   Suture size:  3-0   Suture material:  Prolene   Suture technique:  Simple interrupted   Number of  sutures:  5 Approximation:    Approximation:  Close Repair type:    Repair type:  Simple Post-procedure details:    Dressing:  Open (no dressing)   Procedure completion:  Tolerated well, no immediate complications     Medications Ordered in ED Medications  lidocaine-EPINEPHrine (XYLOCAINE W/EPI) 2 %-1:200000 (PF) injection 10 mL (10 mLs Intradermal Given 09/02/21 2158)  cephALEXin (KEFLEX) capsule 500 mg (500 mg Oral Given 09/02/21 2158)    ED Course/ Medical Decision Making/ A&P                           Medical Decision Making Risk Prescription drug management.   Patient is here with an isolated laceration left lower extremity.  This is from a pig at home.  The wound was cleaned, irrigated, closed with sutures.  Started on Keflex for 5 days as prophylaxis given the nature of the wound.  Okay for discharge.  Doubt underlying fracture.  Do not believe that he needs x-ray imaging at this time.        Final Clinical Impression(s) / ED Diagnoses Final diagnoses:  Laceration of left lower extremity, initial encounter    Rx / DC Orders ED Discharge Orders          Ordered    cephALEXin (KEFLEX) 500 MG capsule  2 times daily        09/02/21 2153              Terald Sleeper, MD 09/02/21 2218

## 2021-09-10 ENCOUNTER — Other Ambulatory Visit (HOSPITAL_COMMUNITY): Payer: Self-pay | Admitting: Nurse Practitioner

## 2021-09-10 DIAGNOSIS — I4891 Unspecified atrial fibrillation: Secondary | ICD-10-CM

## 2021-09-10 NOTE — Telephone Encounter (Signed)
Prescription refill request for Eliquis received. Indication:Afib  Last office visit:08/12/21 Nicolas Curtis)  Scr: 1.23 (06/09/21) Age: 61 Weight: 105.5kg  Appropriate dose refill sent to requested pharmacy.

## 2021-10-24 ENCOUNTER — Other Ambulatory Visit: Payer: Self-pay | Admitting: Student

## 2022-01-20 ENCOUNTER — Other Ambulatory Visit (HOSPITAL_COMMUNITY): Payer: Self-pay | Admitting: Nurse Practitioner

## 2022-01-20 NOTE — Telephone Encounter (Signed)
Pt was last seen in the F-Fib clinic. Can the pt get a refill for this medication, since pt was last seen by Rudi Coco, NP. Please address

## 2022-02-17 ENCOUNTER — Other Ambulatory Visit: Payer: Self-pay | Admitting: Student

## 2022-04-09 ENCOUNTER — Encounter (HOSPITAL_COMMUNITY): Payer: Self-pay | Admitting: *Deleted

## 2022-04-19 ENCOUNTER — Other Ambulatory Visit (HOSPITAL_COMMUNITY): Payer: Self-pay | Admitting: Cardiology

## 2022-04-19 DIAGNOSIS — I4891 Unspecified atrial fibrillation: Secondary | ICD-10-CM

## 2022-04-20 NOTE — Telephone Encounter (Signed)
Prescription refill request for Eliquis received. Indication: AF Last office visit: 08/12/21  Maximino Greenland NP Scr: 1.48 on 06/09/21 Age: 62 Weight: 104.8kg  Based on above findings Eliquis 51m twice daily is the appropriate dose.  Refill approved.

## 2022-05-28 ENCOUNTER — Other Ambulatory Visit: Payer: Self-pay | Admitting: Cardiology

## 2022-06-04 ENCOUNTER — Other Ambulatory Visit (HOSPITAL_COMMUNITY): Payer: Self-pay | Admitting: Cardiology

## 2022-06-30 ENCOUNTER — Other Ambulatory Visit: Payer: Self-pay | Admitting: Cardiology

## 2023-01-04 IMAGING — CT CT ANGIO ABDOMEN
3 of 9 series · 11 of 46 positions shown, 17 images · IV contrast (omnipaque)
Comparison: CT of the abdomen and pelvis 03/02/21 at [DATE].

CLINICAL DATA: Renal ischemia or infarction.  Abnormal CT scan.

EXAM:
CT ANGIOGRAPHY ABDOMEN
TECHNIQUE: Multidetector CT imaging of the abdomen was performed using the
standard protocol during bolus administration of intravenous
contrast. Multiplanar reconstructed images and MIPs were obtained
and reviewed to evaluate the vascular anatomy.
CONTRAST:  80mL OMNIPAQUE IOHEXOL 350 MG/ML SOLN

[Series 5: axial arterial · axial · arterial · 0.83mm/px · z∈[+808,+895]mm · 3 of 103 slices shown]
[im 8/103  soft-tissue]
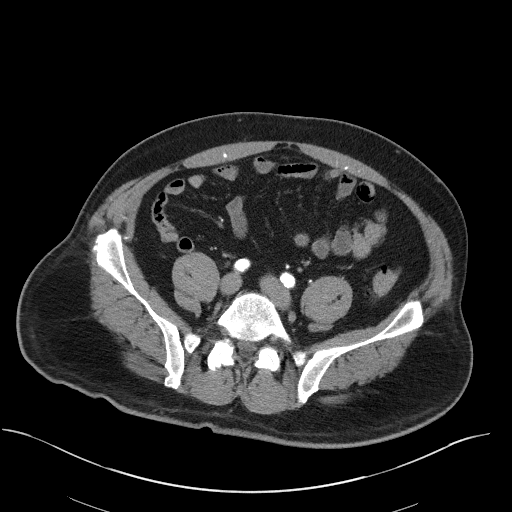
[im 22/103  soft-tissue]
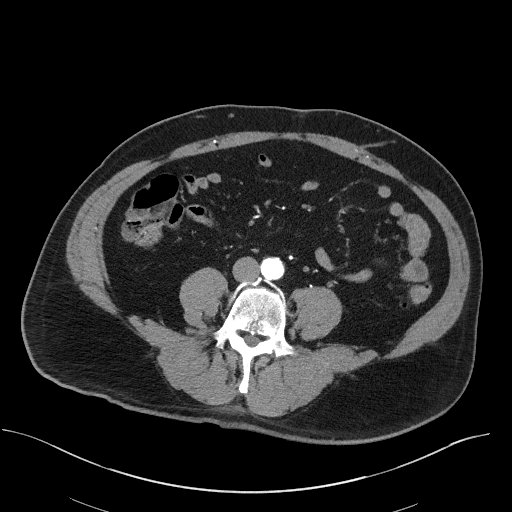
[im 37/103  soft-tissue]
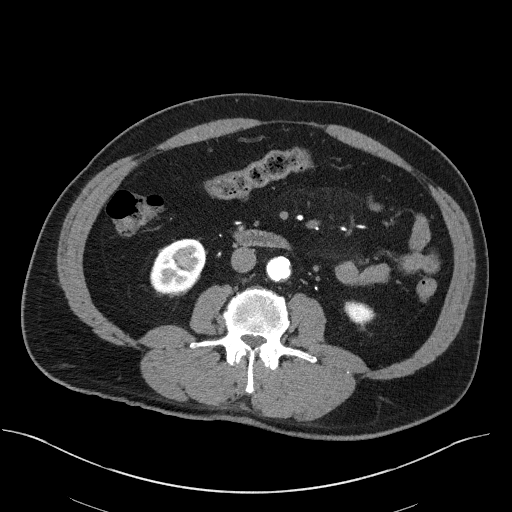

[Series 7: coronal arterial · coronal · arterial · 0.61mm/px · 2 of 102 slices shown, 3 images]
[im 34/102  soft-tissue]
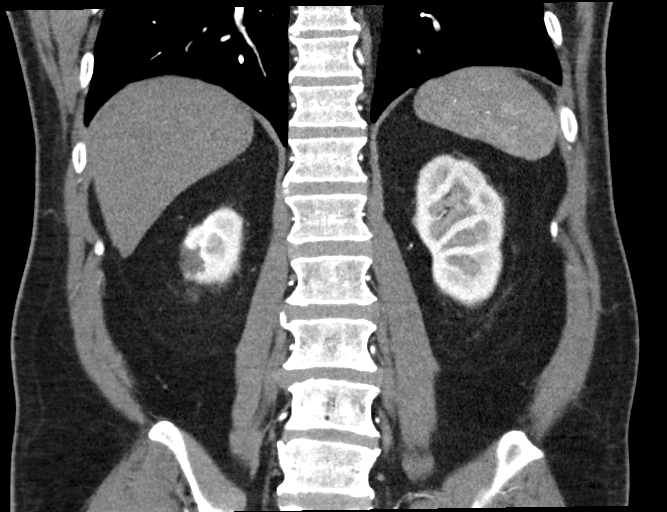
[im 34/102  bone]
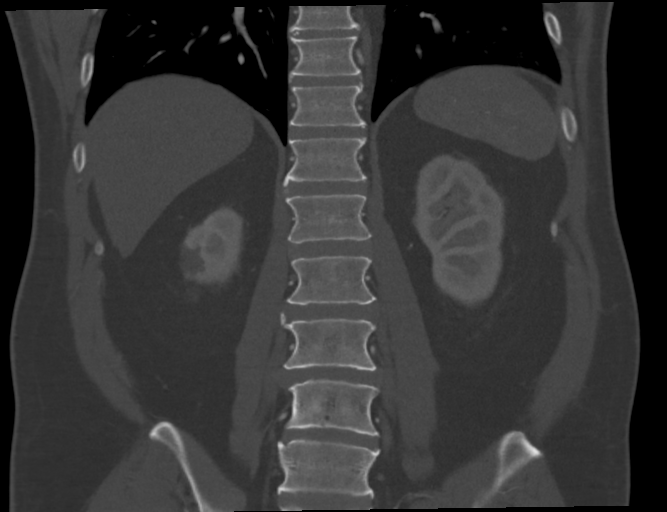
[im 68/102  soft-tissue]
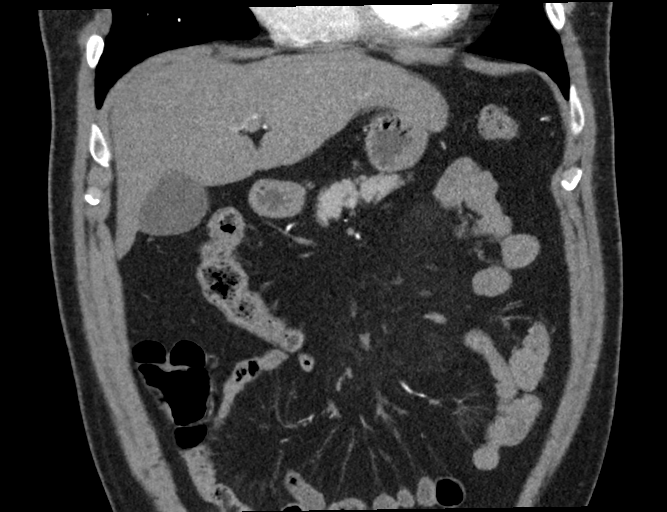

[Series 11: axial venous · axial · portal-venous · 0.83mm/px · z∈[+828,+1048]mm · 6 of 62 slices shown, 11 images]
[im 9/62  soft-tissue]
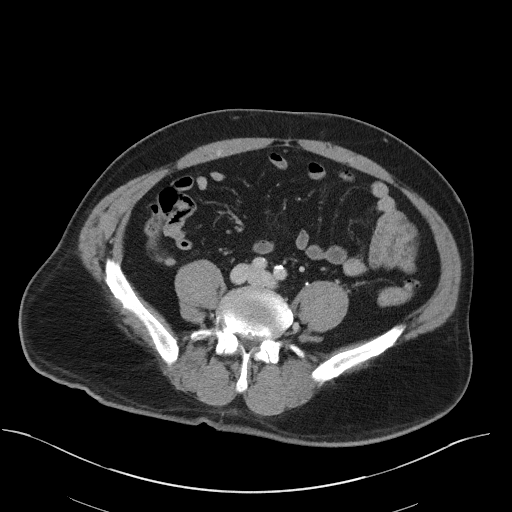
[im 9/62  bone]
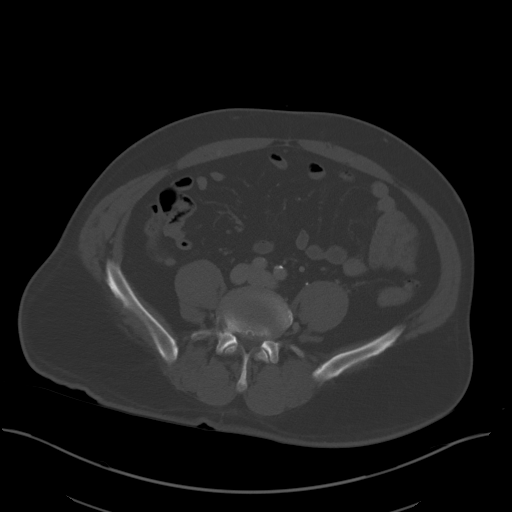
[im 18/62  soft-tissue]
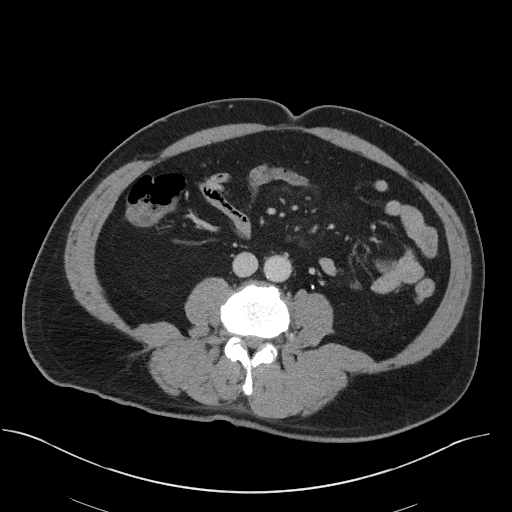
[im 27/62  soft-tissue]
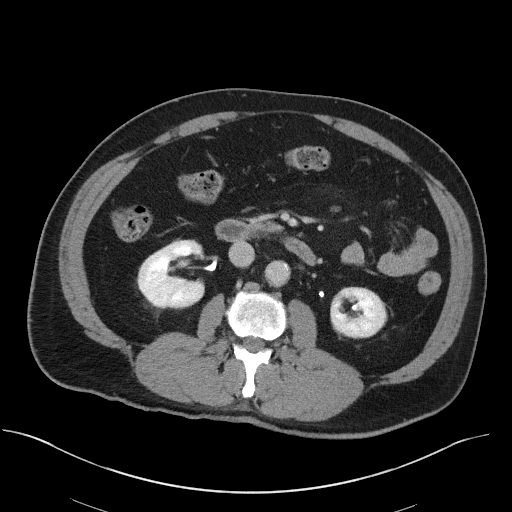
[im 27/62  lung]
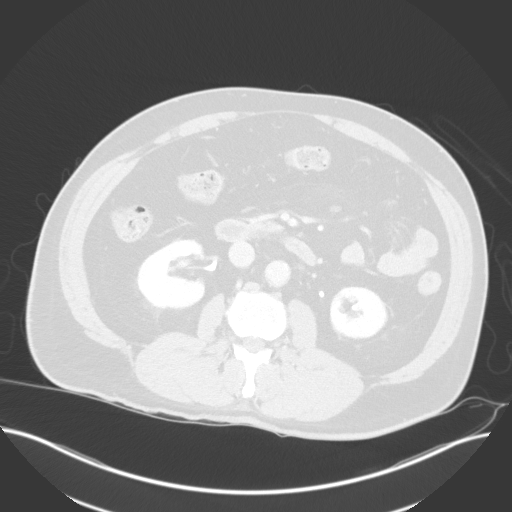
[im 35/62  soft-tissue]
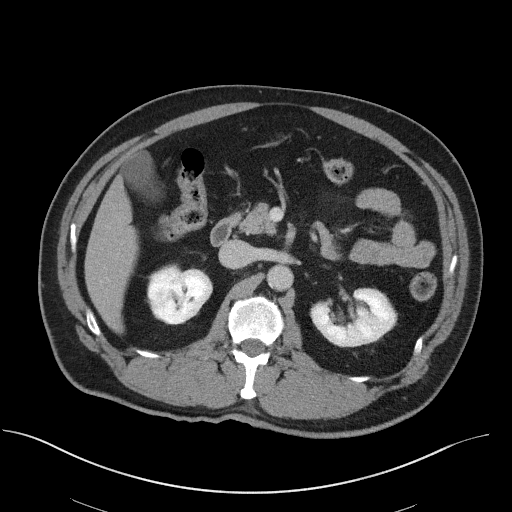
[im 35/62  lung]
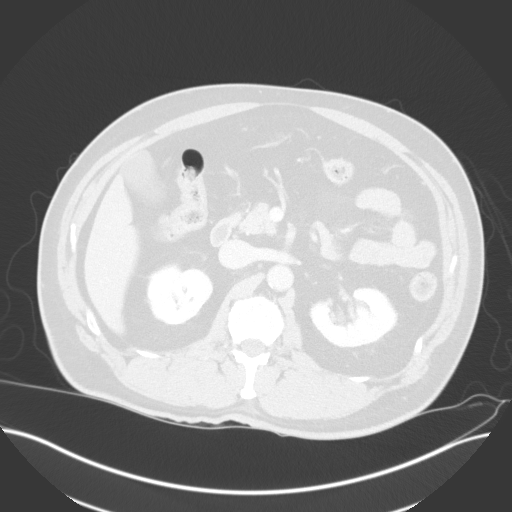
[im 44/62  soft-tissue]
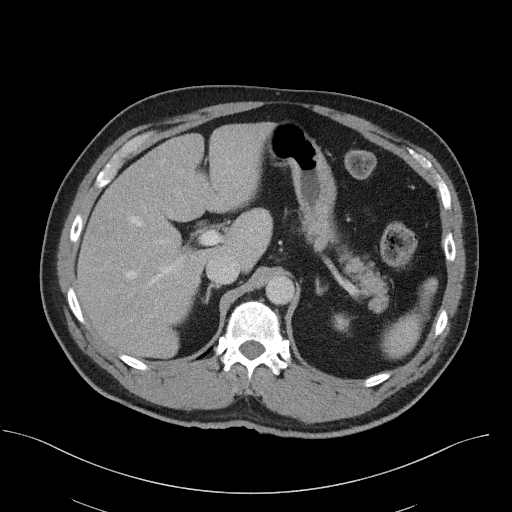
[im 44/62  lung]
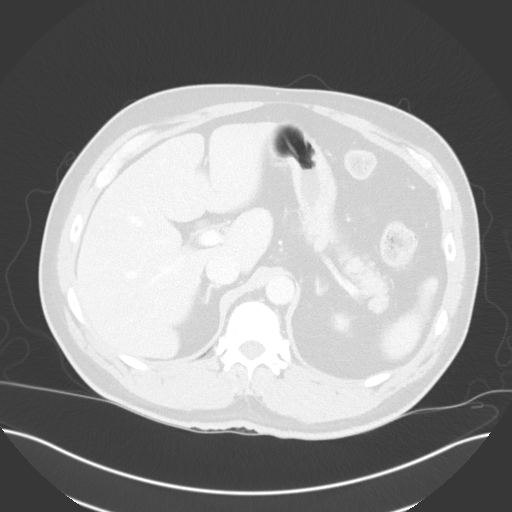
[im 53/62  soft-tissue]
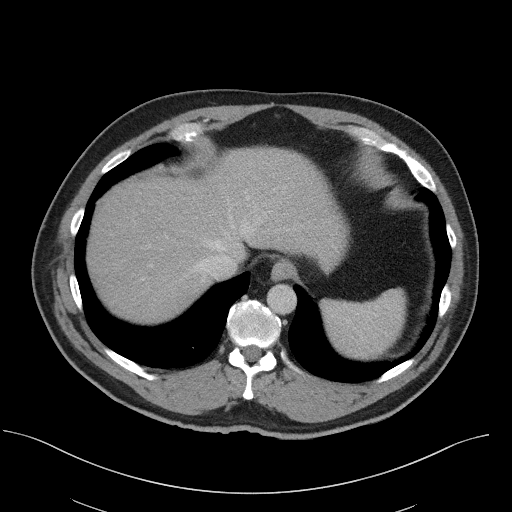
[im 53/62  lung]
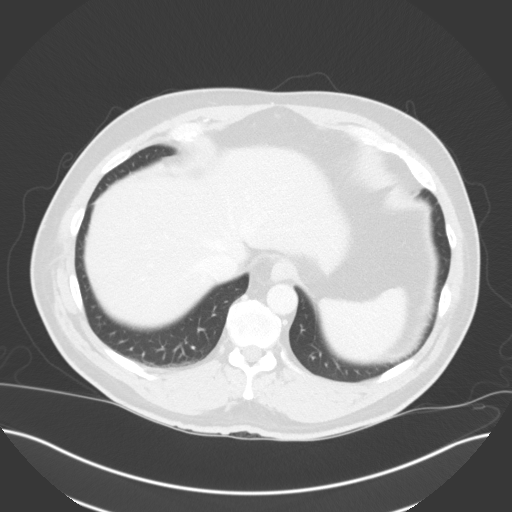

[11 of 46 positions shown; findings below may reference images not displayed]

FINDINGS: VASCULAR

Aorta: Atherosclerotic irregularities are present without aneurysm
or stenosis.

Celiac: Mild narrowing present at the origin without significant
stenosis relative to the more distal vessel. Branch vessels are
within normal limits.

SMA: Patent without evidence of aneurysm, dissection, vasculitis or
significant stenosis.

Renals: Both renal arteries are patent without evidence of aneurysm,
dissection, vasculitis, fibromuscular dysplasia or significant
stenosis.

IMA: Patent without evidence of aneurysm, dissection, vasculitis or
significant stenosis.

Inflow: Patent without evidence of aneurysm, dissection, vasculitis
or significant stenosis.

Veins: No obvious venous abnormality within the limitations of this
arterial phase study.

Review of the MIP images confirms the above findings.

NON-VASCULAR

Lower chest: The lung bases are clear.  Heart size is normal.

Hepatobiliary: Calcified gallstones again noted without inflammatory
change. Diffuse fatty infiltration of the liver noted. No focal
hepatic lesions present.

Pancreas: Unremarkable. No pancreatic ductal dilatation or
surrounding inflammatory changes.

Spleen: Normal in size without focal abnormality.

Adrenals/Urinary Tract: The area of hypoattenuation in the posterior
right kidney is unchanged. No other mass lesion or stone is present.
No obstruction present. Central sinus cyst again noted on the left.
Ureters are within normal limits.

Stomach/Bowel: Colonic diverticula are present. Visualized bowel is
otherwise within normal limits.

Lymphatic: No significant adenopathy is present.

Other: No abdominal wall hernia or abnormality. No abdominopelvic
ascites.

Musculoskeletal: No acute or significant osseous findings.
IMPRESSION: 1. Mild narrowing at the origin of the celiac artery without
significant stenosis relative to the more distal vessel.
2. Atherosclerotic changes of the abdominal aorta without aneurysm
or stenosis.
Aortic Atherosclerosis ([id number]).
3. Stable appearance of low-density in the posterior right kidney.
This likely represents a focal infarct as previously described.
4. Hepatic steatosis.
5. Cholelithiasis without cholecystitis.
6. Colonic diverticulosis without diverticulitis.

## 2023-01-04 IMAGING — CT CT ABD-PELV W/ CM
2 of 5 series · 15 of 46 positions shown, 17 images · IV contrast (Omnipaque or Isovue)
Comparison: 01/31/2013

CLINICAL DATA: Right lower quadrant abdominal pain.

EXAM:
CT ABDOMEN AND PELVIS WITH CONTRAST
TECHNIQUE: Multidetector CT imaging of the abdomen and pelvis was performed
using the standard protocol following bolus administration of
intravenous contrast.
CONTRAST:  80mL OMNIPAQUE IOHEXOL 350 MG/ML SOLN

[Series 2: axial st · axial · 0.98mm/px · z∈[+683,+1153]mm · 12 of 106 slices shown, 14 images]
[im 6/106  soft-tissue]
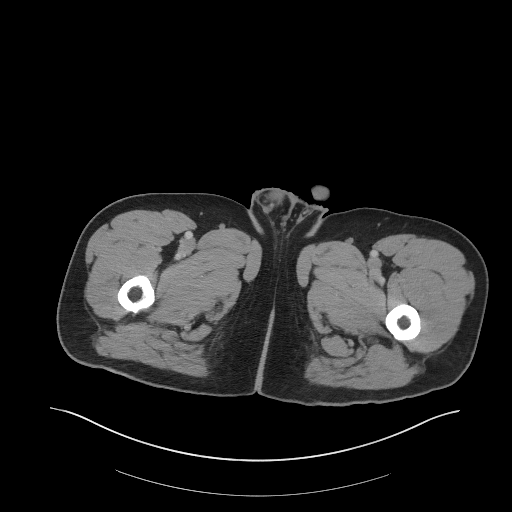
[im 6/106  bone]
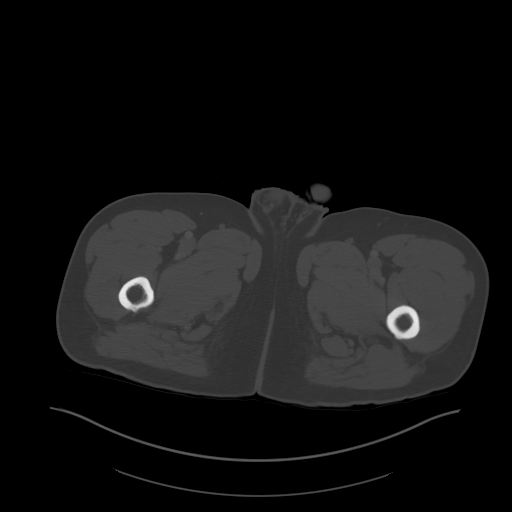
[im 17/106  soft-tissue]
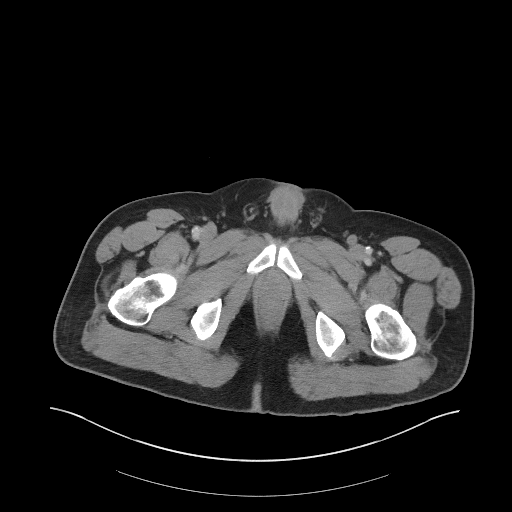
[im 23/106  soft-tissue]
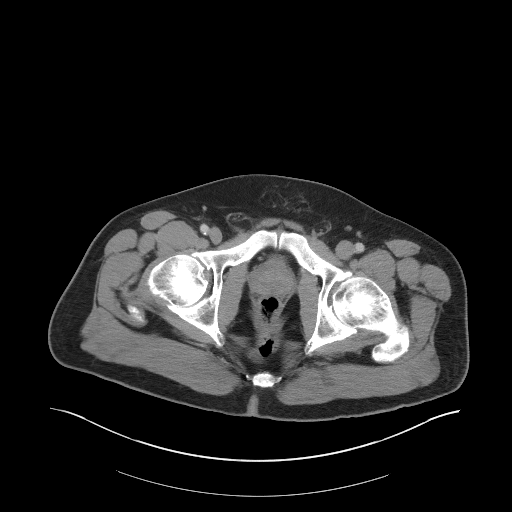
[im 34/106  soft-tissue]
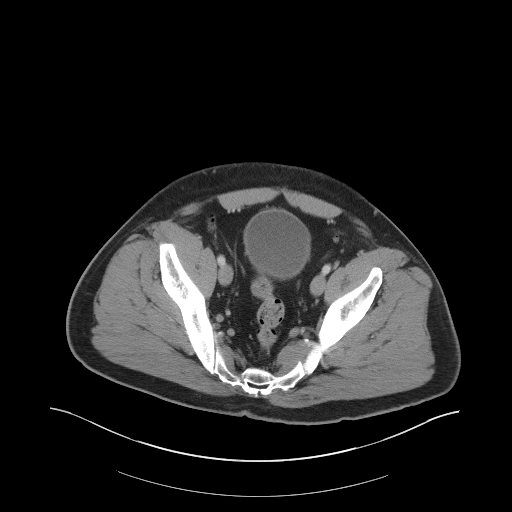
[im 39/106  soft-tissue]
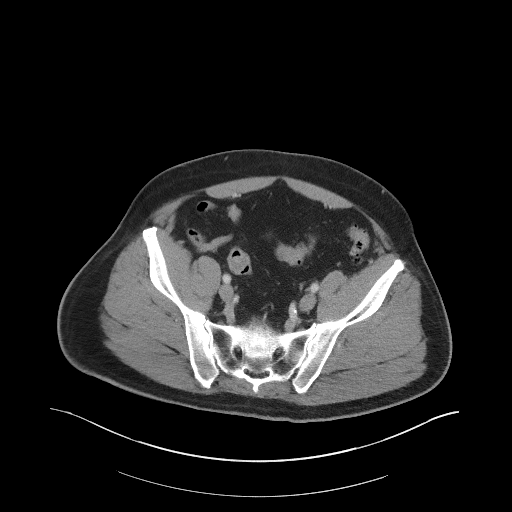
[im 50/106  soft-tissue]
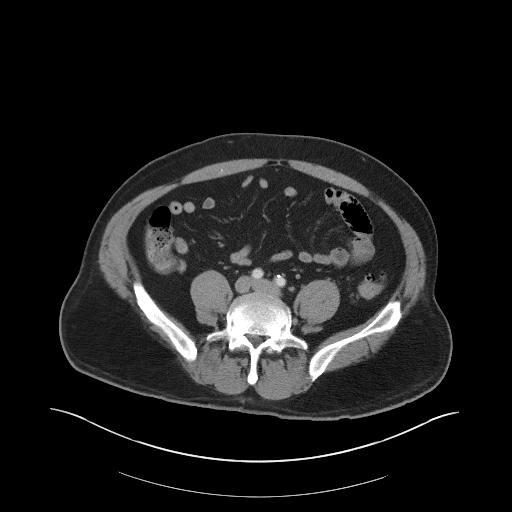
[im 56/106  soft-tissue]
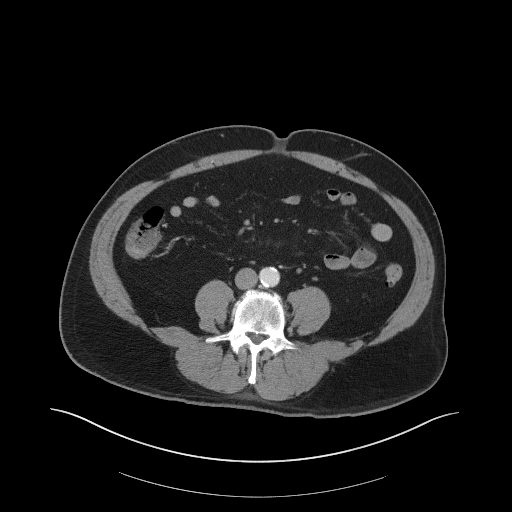
[im 67/106  soft-tissue]
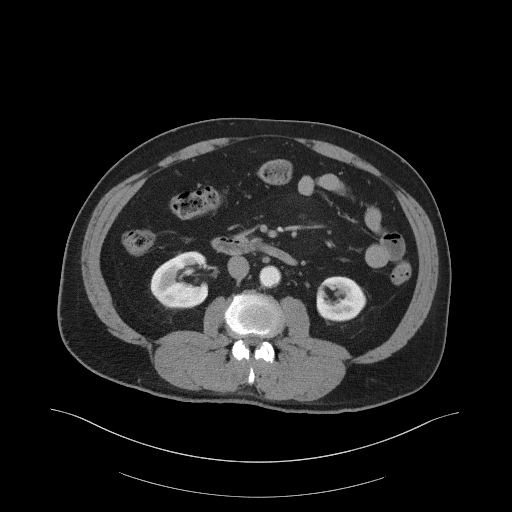
[im 72/106  soft-tissue]
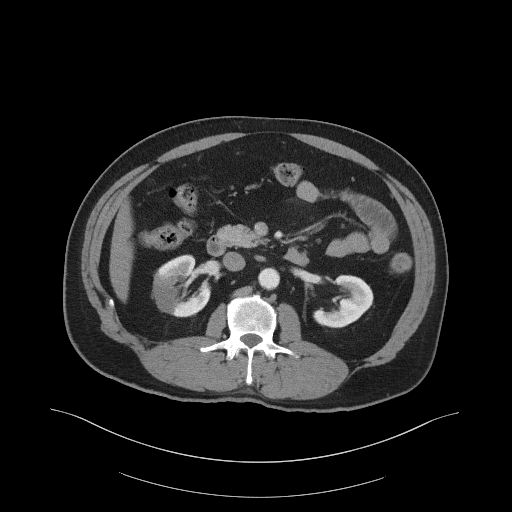
[im 72/106  bone]
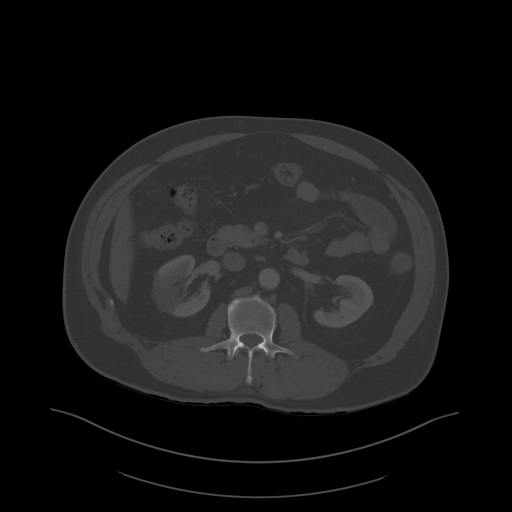
[im 83/106  soft-tissue]
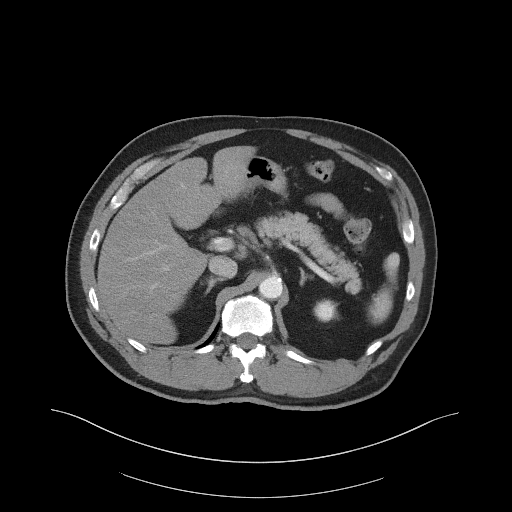
[im 89/106  soft-tissue]
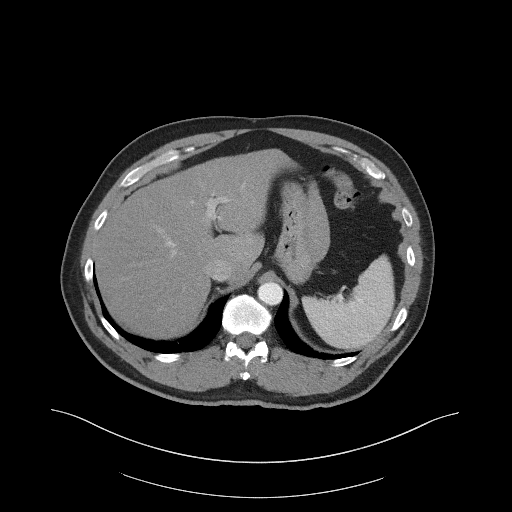
[im 100/106  soft-tissue]
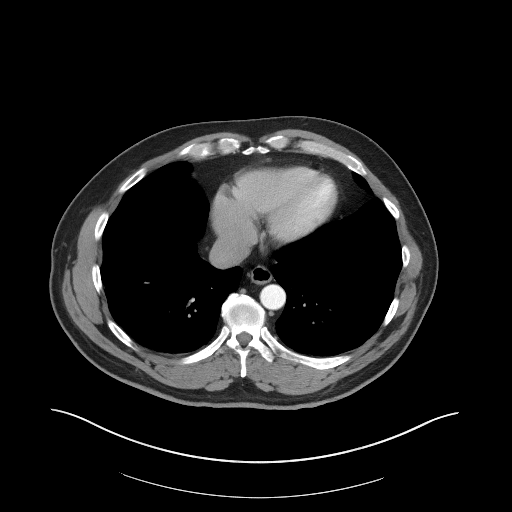

[Series 5: coronal st · coronal · 0.94mm/px · 3 of 120 slices shown]
[im 40/120  soft-tissue]
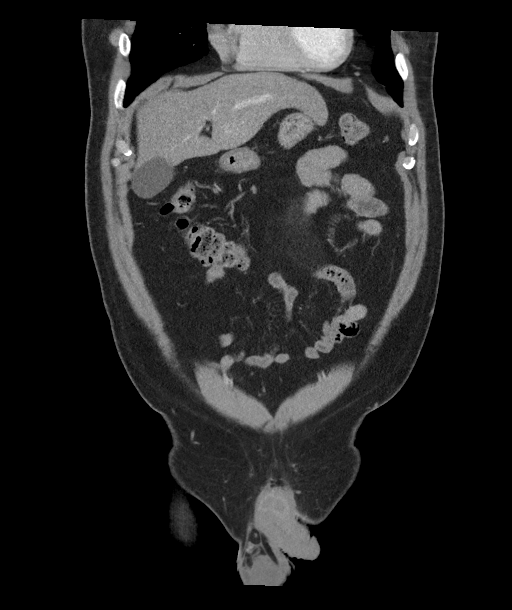
[im 53/120  soft-tissue]
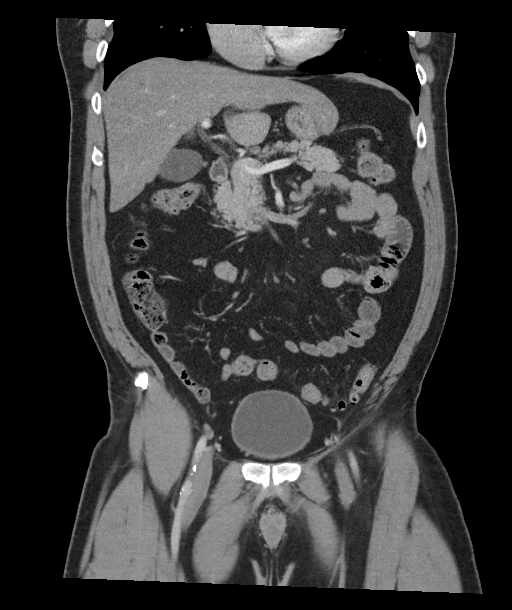
[im 67/120  soft-tissue]
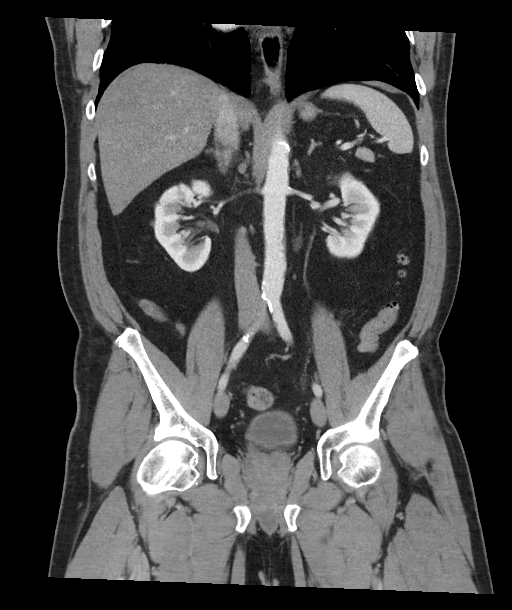

[15 of 46 positions shown; findings below may reference images not displayed]

FINDINGS: Lower chest: Lung bases are clear.

Hepatobiliary: Calcified gallstones. Mild gallbladder distension
without surrounding inflammatory changes. Decreased attenuation in
the liver is suggestive for at least mild steatosis. No focal liver
lesion. No biliary dilatation.

Pancreas: Unremarkable. No pancreatic ductal dilatation or
surrounding inflammatory changes.

Spleen: Normal in size without focal abnormality.

Adrenals/Urinary Tract: Normal adrenal glands. Left renal sinus cyst
measuring up to 1.7 cm. Negative for left hydronephrosis. Focal
low-density in the right kidney interpolar parenchyma. The
low-density has a slightly wedge-shaped configuration and portions
of this low-density have well-defined borders suggesting a vascular
distribution. Minimal right perinephric stranding. Normal appearance
of the urinary bladder. No ureter dilatation.

Stomach/Bowel: Colonic diverticula. No evidence for acute bowel
inflammation. Normal appendix. Normal stomach.

Vascular/Lymphatic: Mild atherosclerotic disease in the abdominal
aorta and iliac arteries. Negative for an aortic aneurysm or
dissection. Main visceral arteries are patent. Evidence for small
bilateral accessory renal arteries. Mild fat stranding in the
central abdominal mesentery appears similar to the exam from 7417.
Slightly prominent lymph nodes in the central abdominal mesentery
are also similar to prior examination. No significant lymph node
enlargement in the abdomen or pelvis.

Reproductive: Prostate is unremarkable.

Other: Negative for ascites. Negative for free air. Small umbilical
hernia containing fat.

Musculoskeletal: No acute bone abnormality.
IMPRESSION: 1. Focal low-density in the right kidney interpolar region.
Differential diagnosis includes renal infarct versus focal
pyelonephritis. No significant right perinephric stranding. Based on
the morphology of the low-density area, favor a renal infarct.
2. Cholelithiasis without evidence for gallbladder inflammation.
3. Slightly decreased attenuation of the liver which could represent
steatosis.
4.  Aortic Atherosclerosis ([id number]).

These results were called by telephone at the time of interpretation
on 03/02/2021 at [DATE] to provider HEGOI RF , who verbally
acknowledged these results.

## 2023-01-05 IMAGING — US US EXTREM LOW VENOUS
1 series · 13 of 24 positions shown · non-contrast
Comparison: None.

CLINICAL DATA: Right renal infarct.



[Series 1: us venous img lower bilat (dvt) · portal-venous · 13 of 73 slices shown]
[im 1/73]
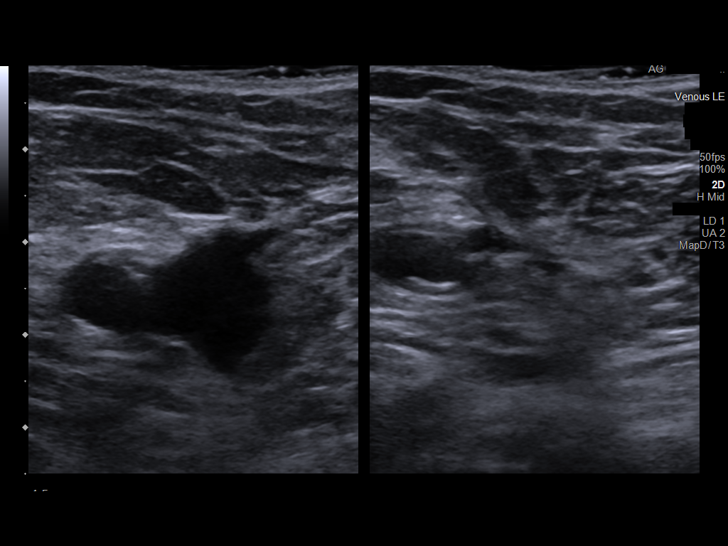
[im 7/73]
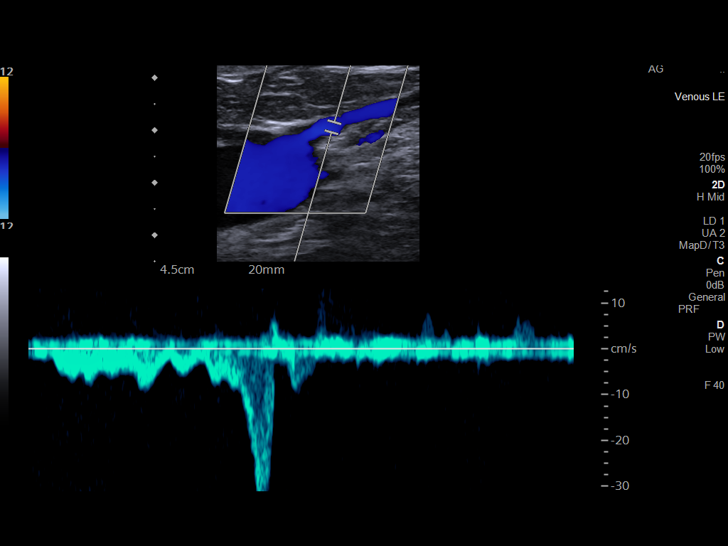
[im 13/73]
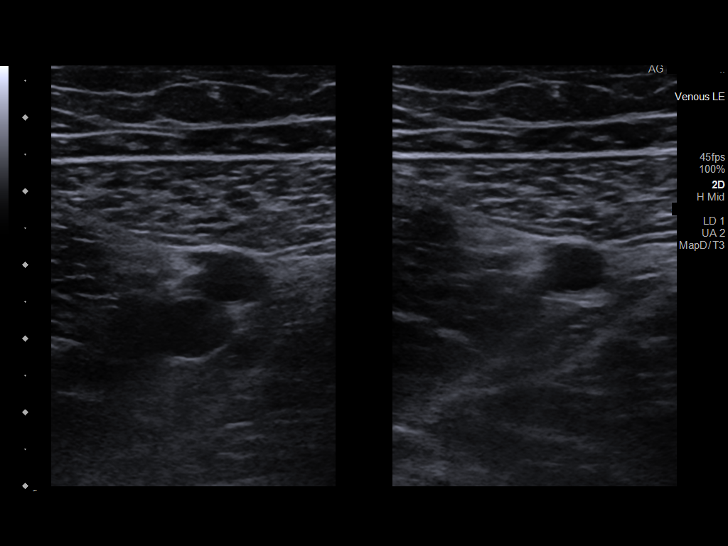
[im 19/73]
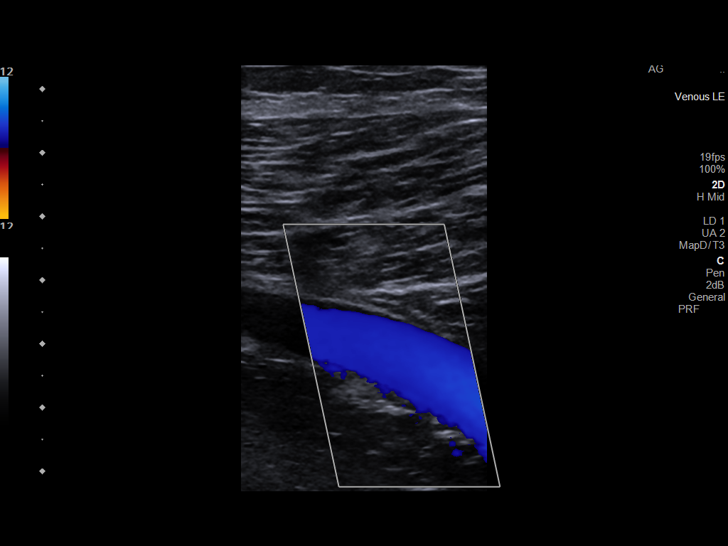
[im 26/73]
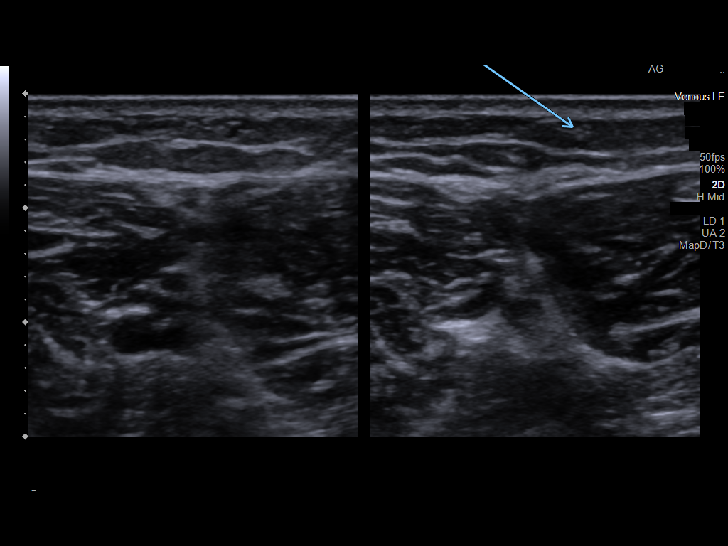
[im 32/73]
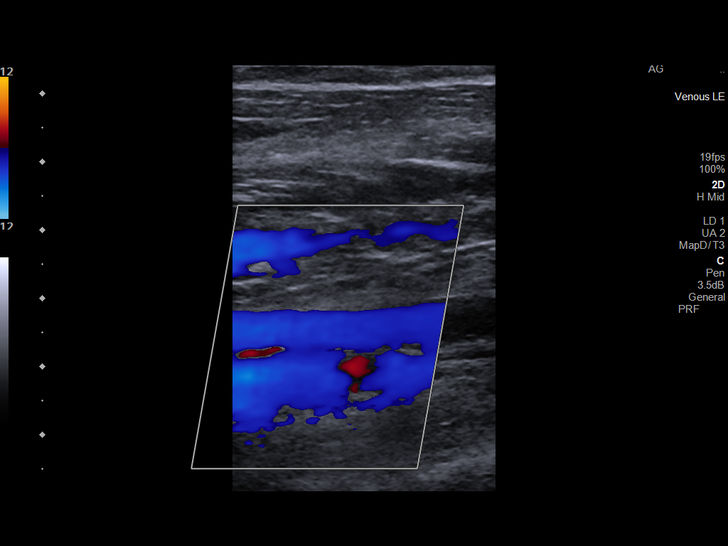
[im 38/73]
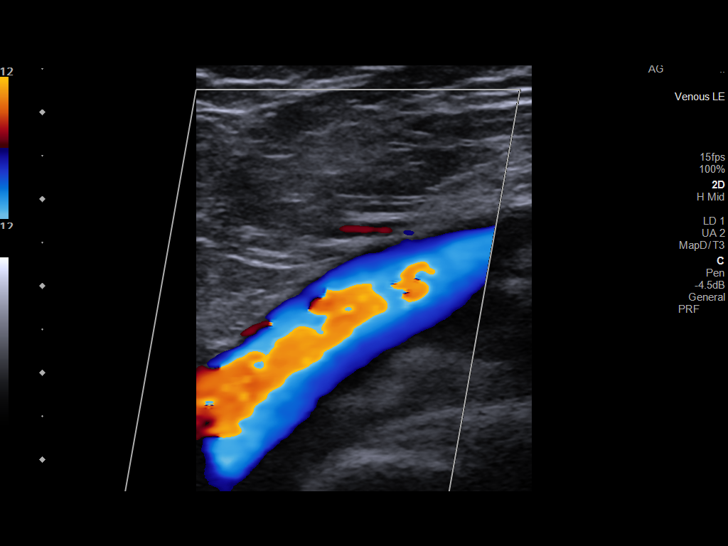
[im 41/73]
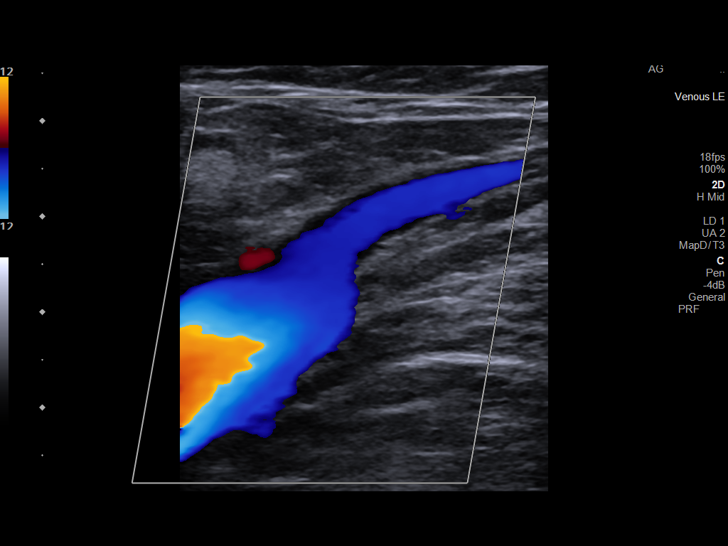
[im 47/73]
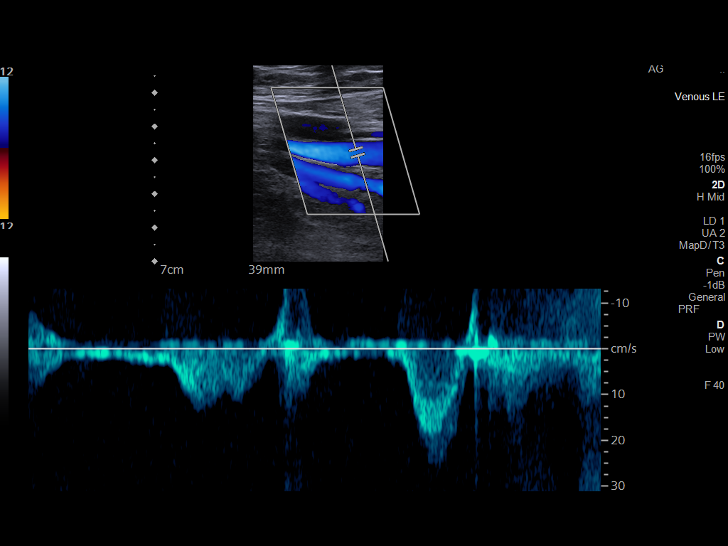
[im 54/73]
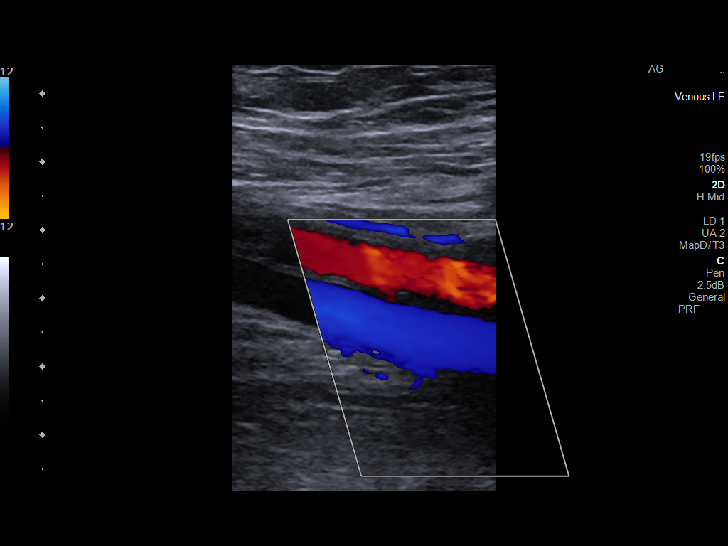
[im 60/73]
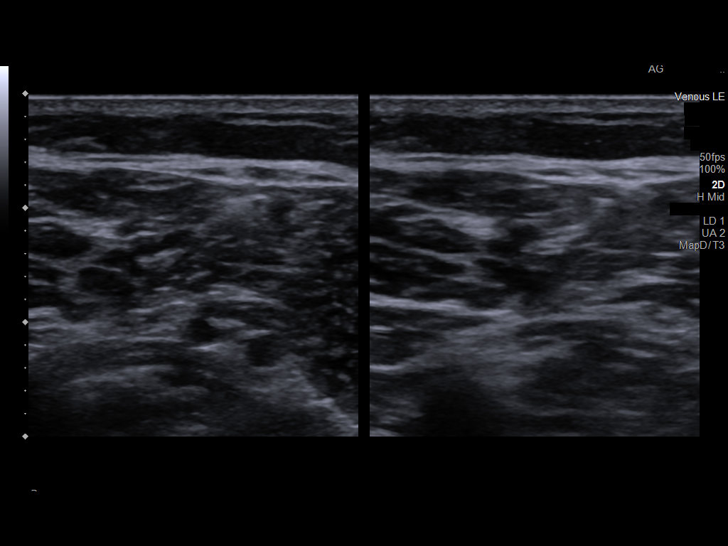
[im 66/73]
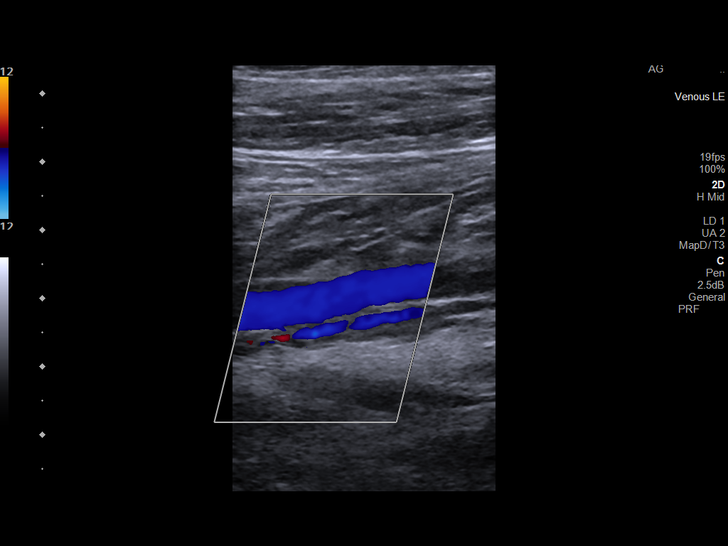
[im 73/73]
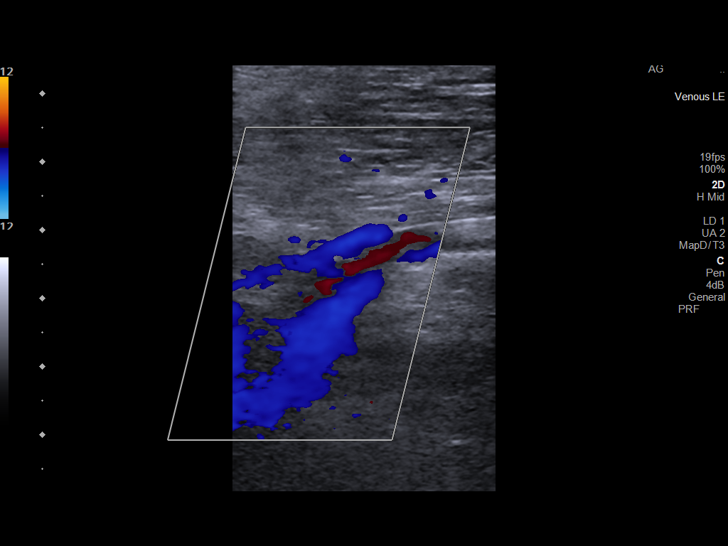

[13 of 24 positions shown; findings below may reference images not displayed]

FINDINGS: RIGHT LOWER EXTREMITY

Common Femoral Vein: No evidence of thrombus. Normal
compressibility, respiratory phasicity and response to augmentation.

Saphenofemoral Junction: No evidence of thrombus. Normal
compressibility and flow on color Doppler imaging.

Profunda Femoral Vein: No evidence of thrombus. Normal
compressibility and flow on color Doppler imaging.

Femoral Vein: No evidence of thrombus. Normal compressibility,
respiratory phasicity and response to augmentation.

Popliteal Vein: No evidence of thrombus. Normal compressibility,
respiratory phasicity and response to augmentation.

Calf Veins: No evidence of thrombus. Normal compressibility and flow
on color Doppler imaging.

LEFT LOWER EXTREMITY

Common Femoral Vein: No evidence of thrombus. Normal
compressibility, respiratory phasicity and response to augmentation.

Saphenofemoral Junction: No evidence of thrombus. Normal
compressibility and flow on color Doppler imaging.

Profunda Femoral Vein: No evidence of thrombus. Normal
compressibility and flow on color Doppler imaging.

Femoral Vein: No evidence of thrombus. Normal compressibility,
respiratory phasicity and response to augmentation.

Popliteal Vein: No evidence of thrombus. Normal compressibility,
respiratory phasicity and response to augmentation.

Calf Veins: No evidence of thrombus. Normal compressibility and flow
on color Doppler imaging.
IMPRESSION: No evidence of deep venous thrombosis in either lower extremity.

## 2023-02-17 ENCOUNTER — Other Ambulatory Visit (HOSPITAL_COMMUNITY): Payer: Self-pay | Admitting: Cardiology

## 2023-02-17 DIAGNOSIS — I4891 Unspecified atrial fibrillation: Secondary | ICD-10-CM

## 2023-02-17 NOTE — Telephone Encounter (Signed)
Prescription refill request for Eliquis received. Indication: AF Last office visit: 08/12/21 Everlena Cooper NP Scr: 1.23 on 06/09/21 Age: 62 Weight: 104.8  Based on above findings Eliquis 5mg  twice daily is the appropriate dose.  Pt is past due for appt with Dr Wyline Mood.  Message sent to schedulers.  Refill approved x 1 only.

## 2023-05-03 ENCOUNTER — Telehealth: Payer: Self-pay | Admitting: Cardiology

## 2023-05-03 MED ORDER — METOPROLOL SUCCINATE ER 50 MG PO TB24: 50.0000 mg | ORAL_TABLET | Freq: Two times a day (BID) | ORAL | 0 refills | Status: DC

## 2023-05-03 NOTE — Telephone Encounter (Signed)
*  STAT* If patient is at the pharmacy, call can be transferred to refill team.   1. Which medications need to be refilled? (please list name of each medication and dose if known)   metoprolol succinate (TOPROL-XL) 50 MG 24 hr tablet     2. Would you like to learn more about the convenience, safety, & potential cost savings by using the [facility]? No   3. Are you open to using the [facility] (Type [facility]. ) No   4. Which pharmacy/location (including street and city if local pharmacy) is medication to be sent to? [id number] - [place], [place] - [address]    5. Do they need a 30 day or 90 day supply? 90 day  Pt has scheduled appt on 4/10 and is out of medication

## 2023-06-09 NOTE — Progress Notes (Unsigned)
  Electrophysiology Office Follow up Visit Note:    Date:  06/09/2023   ID:  [name], DOB March 21, 1960, MRN [id number]  PCP:  Salli Real, MD  [facility] Cardiologist:  Dina Rich, MD  [facility] Electrophysiologist:  Lanier Prude, MD    Interval History:     Nicolas Curtis is a 63 y.o. male who presents for a follow up visit.   He had a catheter ablation for atrial fibrillation Jul 03, 2021 during which the pulmonary veins and posterior wall were isolated. He last saw cardiology in June 2023 with Rudi Coco in the A-fib clinic.  At that appointment he reported no recurrence of his arrhythmia.  At that appointment he was still taking metoprolol succinate 50 mg by mouth twice daily.  He was on Eliquis for stroke prophylaxis.  A stress test was ordered given chest discomfort.       Past medical, surgical, social and family history were reviewed.  ROS:   Please see the history of present illness.    All other systems reviewed and are negative.  EKGs/Labs/Other Studies Reviewed:    The following studies were reviewed today:  August 18, 2021 SPECT Low risk, no evidence of ischemia or infarction        Physical Exam:    VS:  There were no vitals taken for this visit.    Wt Readings from Last 3 Encounters:  09/02/21 232 lb (105.2 kg)  08/12/21 231 lb (104.8 kg)  07/03/21 232 lb (105.2 kg)     GEN: no distress CARD: RRR, No MRG RESP: No IWOB. CTAB.      ASSESSMENT:    No diagnosis found. PLAN:    In order of problems listed above:  #Atrial fibrillation Prior catheter ablation Jul 03, 2021. On Eliquis for stroke prophylaxis Continue metoprolol succinate 50 mg by mouth twice daily  #Hypertension *** goal today.  Recommend checking blood pressures 1-2 times per week at home and recording the values.  Recommend bringing these recordings to the primary care physician. Continue losartan and metoprolol  Follow-up with EP on an as-needed basis.   Continue routine primary care follow-up.  Signed, Steffanie Dunn, MD, [facility], [facility] 06/09/2023 9:51 AM    Electrophysiology [facility]

## 2023-06-10 ENCOUNTER — Encounter: Payer: Self-pay | Admitting: Cardiology

## 2023-06-10 ENCOUNTER — Ambulatory Visit: Payer: 59 | Attending: Cardiology | Admitting: Cardiology

## 2023-06-10 VITALS — BP 126/76 | HR 57 | Ht 71.0 in | Wt 223.7 lb

## 2023-06-10 DIAGNOSIS — I4891 Unspecified atrial fibrillation: Secondary | ICD-10-CM | POA: Diagnosis not present

## 2023-06-10 DIAGNOSIS — I10 Essential (primary) hypertension: Secondary | ICD-10-CM

## 2023-06-10 MED ORDER — METOPROLOL SUCCINATE ER 50 MG PO TB24: 50.0000 mg | ORAL_TABLET | Freq: Two times a day (BID) | ORAL | 3 refills | Status: AC

## 2023-06-10 NOTE — Addendum Note (Signed)
 Addended by: Frutoso Schatz on: 06/10/2023 09:24 AM   Modules accepted: Orders

## 2023-06-10 NOTE — Patient Instructions (Addendum)
 Medication Instructions:  Your physician recommends that you continue on your current medications as directed. Please refer to the Current Medication list given to you today.  *If you need a refill on your cardiac medications before your next appointment, please call your pharmacy*  Follow-Up: At [facility], you and your health needs are our priority.  As part of our continuing mission to provide you with exceptional heart care, our providers are all part of one team.  This team includes your primary Cardiologist (physician) and Advanced Practice Providers or APPs (Physician Assistants and Nurse Practitioners) who all work together to provide you with the care you need, when you need it.  Your next appointment:   1 year  Provider:   You will see one of the following Advanced Practice Providers on your designated Care Team:   Francis Dowse, [place] Casimiro Needle "Mardelle Matte" Wagoner, PA-C Sherie Don, NP Canary Brim, NP       1st Floor: - Lobby - Registration  - Pharmacy  - Lab - Cafe  2nd Floor: - PV Lab - Diagnostic Testing (echo, CT, nuclear med)  3rd Floor: - Vacant  4th Floor: - TCTS (cardiothoracic surgery) - AFib Clinic - Structural Heart Clinic - Vascular Surgery  - Vascular Ultrasound  5th Floor: - HeartCare Cardiology (general and EP) - Clinical Pharmacy for coumadin, hypertension, lipid, weight-loss medications, and med management appointments    Valet parking services will be available as well.

## 2023-07-30 ENCOUNTER — Ambulatory Visit: Payer: 59 | Attending: Cardiology | Admitting: Cardiology

## 2023-07-30 ENCOUNTER — Encounter: Payer: Self-pay | Admitting: *Deleted

## 2023-07-30 ENCOUNTER — Encounter: Payer: Self-pay | Admitting: Cardiology

## 2023-07-30 VITALS — BP 138/84 | HR 57 | Ht 71.0 in | Wt 225.6 lb

## 2023-07-30 DIAGNOSIS — I2510 Atherosclerotic heart disease of native coronary artery without angina pectoris: Secondary | ICD-10-CM | POA: Diagnosis not present

## 2023-07-30 DIAGNOSIS — I10 Essential (primary) hypertension: Secondary | ICD-10-CM | POA: Diagnosis not present

## 2023-07-30 DIAGNOSIS — I480 Paroxysmal atrial fibrillation: Secondary | ICD-10-CM | POA: Diagnosis not present

## 2023-07-30 NOTE — Patient Instructions (Signed)

## 2023-07-30 NOTE — Progress Notes (Signed)
 Clinical Summary Mr. Pla is a 63 y.o.male seen today for the following medical problems. Has followed with afib clinic and Dr Marven Slimmer, this is our first visit together.   1.Afib - new diagnosis Jan 2023 - history of ablation 06/2021 - denies any palpitations.  - he stopped eliquis  on his own initially,at EP f/u decided to just continue ASA at this time.   2.HTN - compliant with meds - reports at recent pcp visit 120s-130s/60s.    3. History of chest pain - 07/2021 nuclear stress: no ischemia - pins/needles like feeling left chest at times, no other associated symptoms. Can sometimes be positional. Can sometimes be associated with his chronic GERD as well.    4.History of renal infarct - off eliquis , history is unclear. Was on at 07/2021 with plans to continue. No longer taking at 06/2023 EP appt.    5. LV systolic dysfunction - Jan 2023 echo: LVEF 45-50% - sedentary due to job, drives a truck every day - some SOB with steep inclinesd - no recent LE edema.   6. Coronary atherosclerosis - elevated calcium  score by 2023 CT he had in prep for ablation - 2023 nuclear stress no ischemia.  - he is on ASA, statin.  - recent labs with pcp Past Medical History:  Diagnosis Date   History of stomach ulcers    PAF (paroxysmal atrial fibrillation) (HCC)    a. diagnosed in 03/2021 during admission for splenic infarct   Ruptured disc, thoracic      Allergies  Allergen Reactions   Codeine Rash   Oxycodone Rash     Current Outpatient Medications  Medication Sig Dispense Refill   aspirin  EC 81 MG tablet Take 81 mg by mouth daily. Swallow whole.     Ibuprofen  (ADVIL  PO) Take 2 tablets by mouth daily as needed (pain).     losartan  (COZAAR ) 25 MG tablet TAKE 1/2 TABLET BY MOUTH DAILY (Patient taking differently: Take 25 mg by mouth daily.) 7 tablet 0   metoprolol  succinate (TOPROL -XL) 50 MG 24 hr tablet Take 1 tablet (50 mg total) by mouth in the morning and at bedtime.  Take with or immediately following a meal. 180 tablet 3   rosuvastatin (CRESTOR) 20 MG tablet Take 20 mg by mouth at bedtime.     No current facility-administered medications for this visit.     Past Surgical History:  Procedure Laterality Date   ATRIAL FIBRILLATION ABLATION N/A 07/03/2021   Procedure: ATRIAL FIBRILLATION ABLATION;  Surgeon: Boyce Byes, MD;  Location: [facility] INVASIVE CV LAB;  Service: Cardiovascular;  Laterality: N/A;   COLONOSCOPY     ESOPHAGOGASTRODUODENOSCOPY     FOOT SURGERY Right    spacer placed between first and second digits   gastric ulcer     scoliosis       Allergies  Allergen Reactions   Codeine Rash   Oxycodone Rash      Family History  Problem Relation Age of Onset   Atrial fibrillation Mother      Social History Mr. Terrero reports that he has quit smoking. He has never used smokeless tobacco. Mr. Chauvin reports current alcohol use.     Physical Examination Today's Vitals   07/30/23 0840  BP: (!) 140/80  Pulse: (!) 57  SpO2: 96%  Weight: 225 lb 9.6 oz (102.3 kg)  Height: 5\' 11"  (1.803 m)   Body mass index is 31.46 kg/m.  Gen: resting comfortably, no acute distress HEENT: no scleral  icterus, pupils equal round and reactive, no palptable cervical adenopathy,  CV: RRR, no mrg, no jvd Resp: Clear to auscultation bilaterally GI: abdomen is soft, non-tender, non-distended, normal bowel sounds, no hepatosplenomegaly MSK: extremities are warm, no edema.  Skin: warm, no rash Neuro:  no focal deficits Psych: appropriate affect     Assessment and Plan   1.Afib - s/p ablation - he had stopped eliquis , from recent EP visit plans to just continue ASA - no symptoms, conitnue to monitor  2. HTN - elevated here but bp's at other visits has been well controlled including last month with EP - continue current meds  3. HLD - request pcp labs  4. Coronary atherosclerosis  Noted by prior cardiac CT, nuclear stress around that  time without ischemia - continue risk factor modiciation, he is on ASA and statin   F/u 1 year.      Laurann Pollock, M.D.

## 2023-11-26 ENCOUNTER — Encounter: Payer: Self-pay | Admitting: Cardiology
